# Patient Record
Sex: Female | Born: 1980 | Race: White | Hispanic: No | Smoking: Current every day smoker
Health system: Southern US, Community
[De-identification: ages and names within clinical notes are randomized; demographics above are authoritative.]

## PROBLEM LIST (undated history)

## (undated) DIAGNOSIS — F419 Anxiety disorder, unspecified: Secondary | ICD-10-CM

## (undated) DIAGNOSIS — O039 Complete or unspecified spontaneous abortion without complication: Secondary | ICD-10-CM

## (undated) DIAGNOSIS — F329 Major depressive disorder, single episode, unspecified: Secondary | ICD-10-CM

## (undated) DIAGNOSIS — F32A Depression, unspecified: Secondary | ICD-10-CM

## (undated) DIAGNOSIS — J40 Bronchitis, not specified as acute or chronic: Secondary | ICD-10-CM

## (undated) HISTORY — PX: ANKLE SURGERY: SHX546

## (undated) SURGERY — Surgical Case
Anesthesia: *Unknown

---

## 2002-12-22 ENCOUNTER — Other Ambulatory Visit: Admission: RE | Admit: 2002-12-22 | Discharge: 2002-12-22 | Payer: Self-pay | Admitting: Obstetrics & Gynecology

## 2003-06-12 ENCOUNTER — Emergency Department (HOSPITAL_COMMUNITY): Admission: EM | Admit: 2003-06-12 | Discharge: 2003-06-12 | Payer: Self-pay | Admitting: Emergency Medicine

## 2003-06-23 ENCOUNTER — Emergency Department (HOSPITAL_COMMUNITY): Admission: EM | Admit: 2003-06-23 | Discharge: 2003-06-23 | Payer: Self-pay | Admitting: Emergency Medicine

## 2005-05-26 ENCOUNTER — Inpatient Hospital Stay (HOSPITAL_COMMUNITY): Admission: EM | Admit: 2005-05-26 | Discharge: 2005-05-29 | Payer: Self-pay | Admitting: Emergency Medicine

## 2005-07-16 ENCOUNTER — Encounter (HOSPITAL_COMMUNITY): Admission: RE | Admit: 2005-07-16 | Discharge: 2005-08-15 | Payer: Self-pay | Admitting: Orthopaedic Surgery

## 2007-11-30 ENCOUNTER — Other Ambulatory Visit: Admission: RE | Admit: 2007-11-30 | Discharge: 2007-11-30 | Payer: Self-pay | Admitting: Obstetrics and Gynecology

## 2007-12-04 IMAGING — CR DG ANKLE COMPLETE 3+V*L*
3 series · 3 of 3 positions shown · non-contrast
Comparison: none

CLINICAL DATA: Stepped in hole

[view not recorded (1 of 3)]
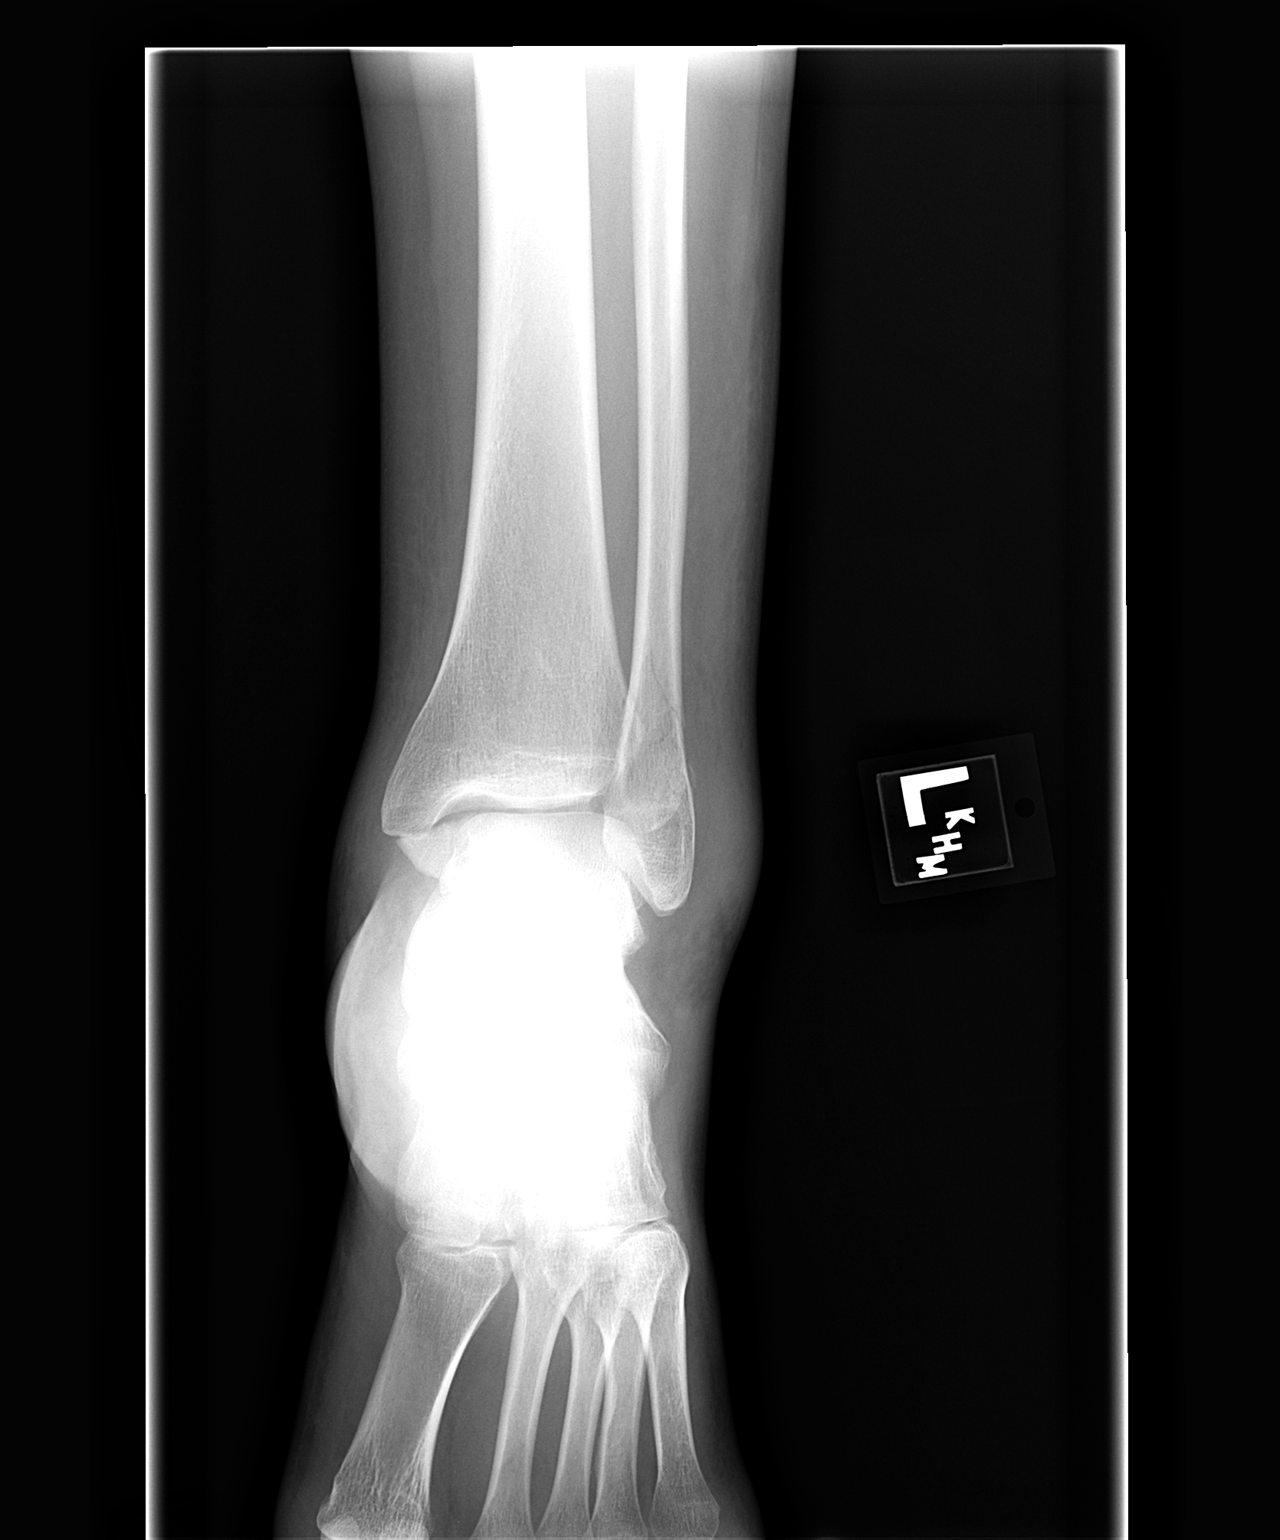

[view not recorded (2 of 3)]
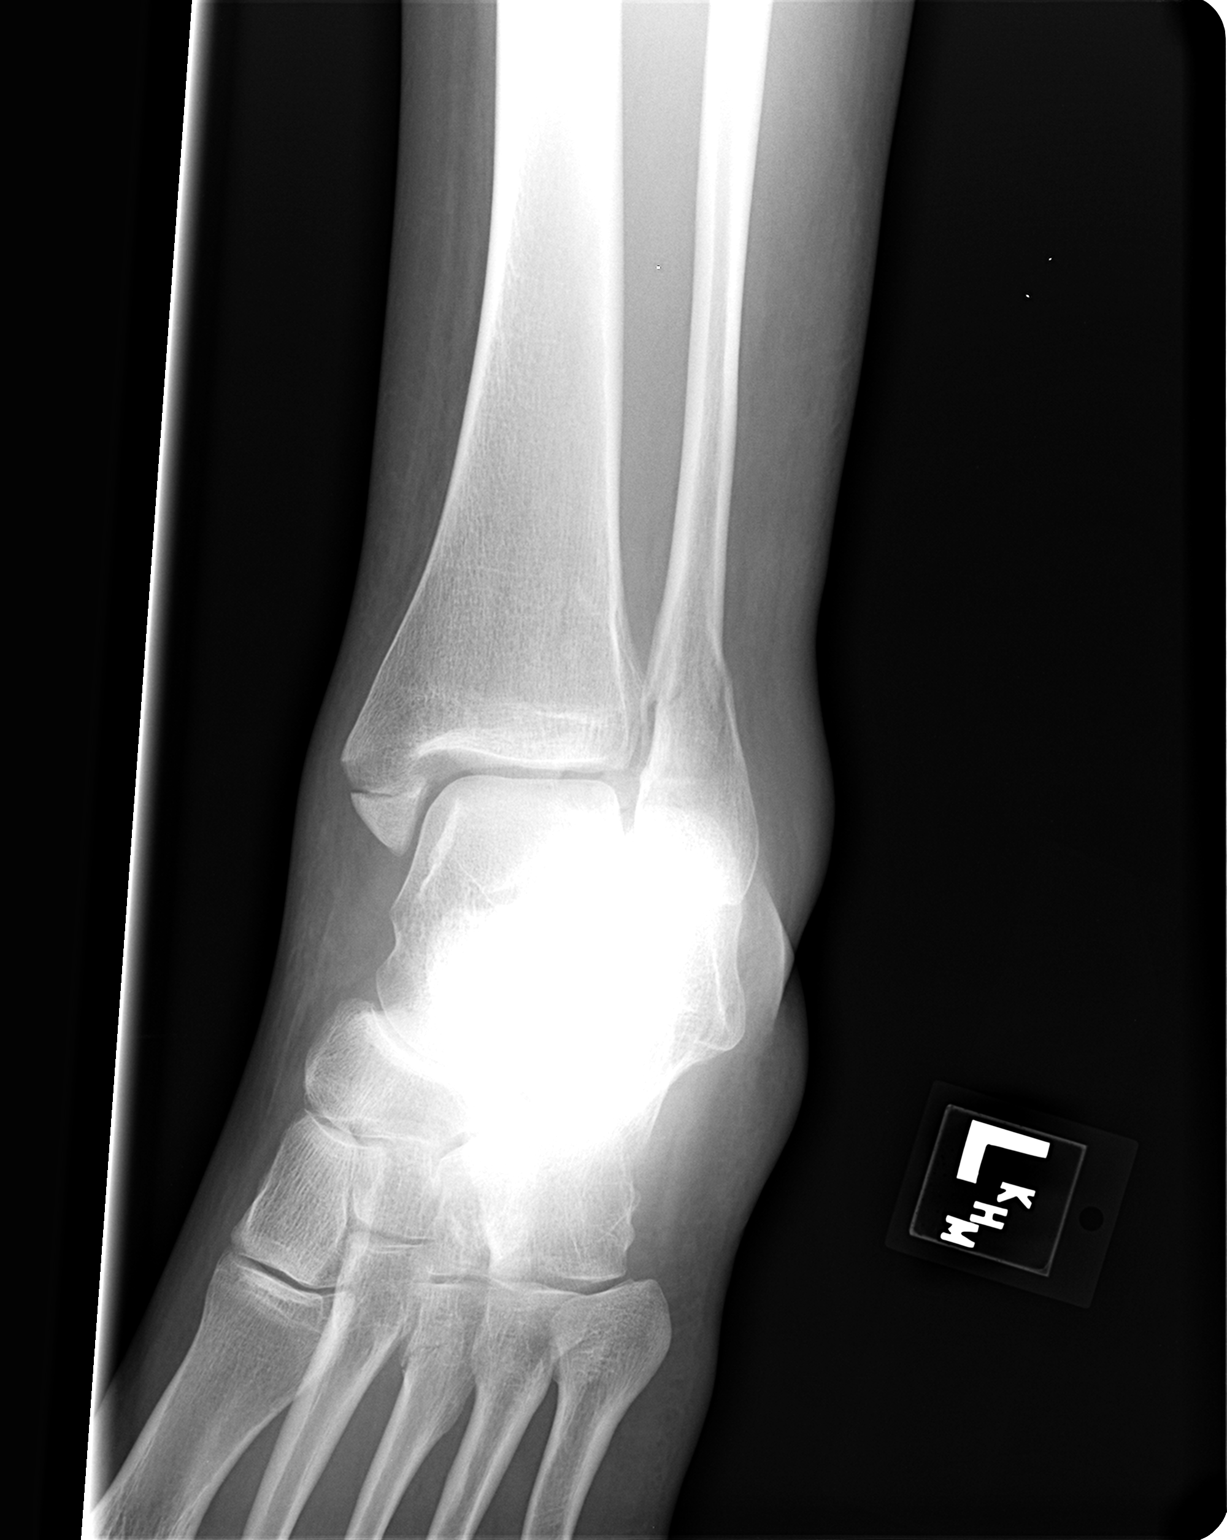

[view not recorded (3 of 3)]
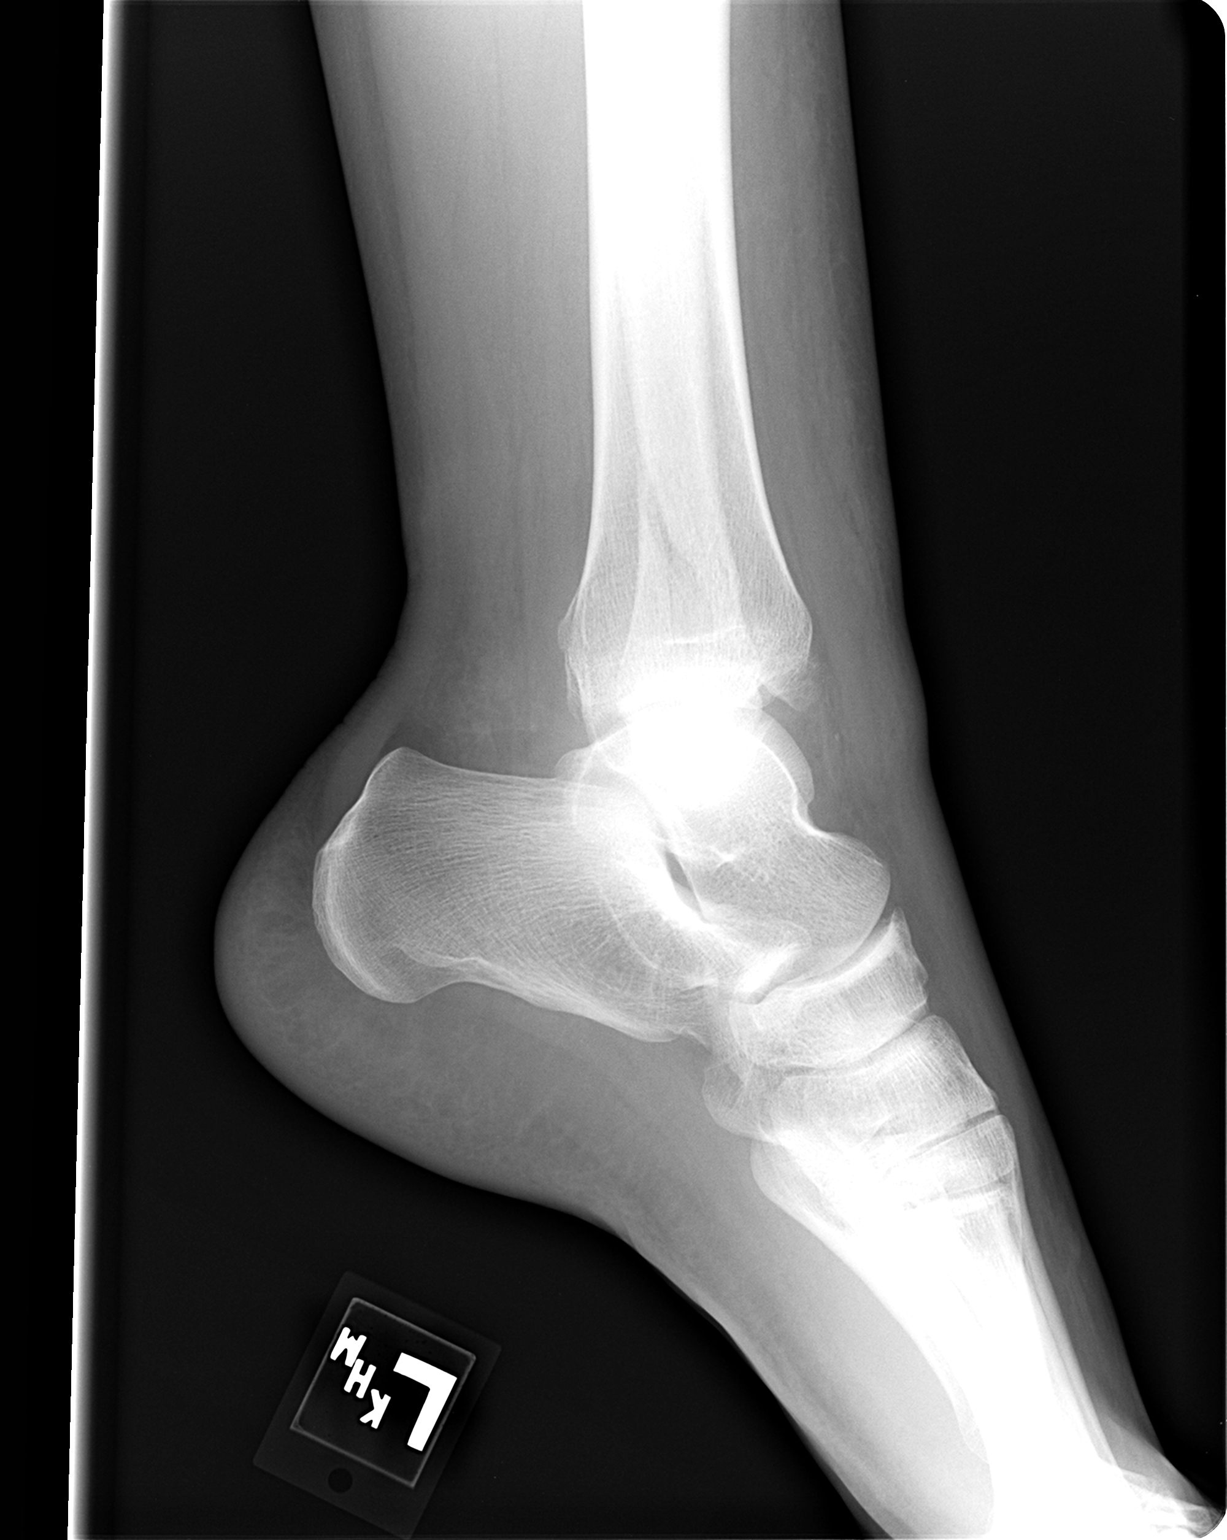

[3 of 3 positions shown; findings below may reference images not displayed]

Left ankle three-view:

No previous for comparison. Transverse fracture of the medial malleolus with
lateral displacement of distal fracture fragment and step-off deformity of the
subchondral cortex. Oblique fracture through the distal fibular shaft with
minimal lateral displacement and angulation of distal fracture fragment. Lateral
view suggests fracture of the posterior malleolus. Circumferential soft tissue
swelling is evident.
IMPRESSION: 1. Trimalleolar fracture, minimally displaced, an unstable injury.

## 2008-09-07 ENCOUNTER — Emergency Department (HOSPITAL_COMMUNITY): Admission: EM | Admit: 2008-09-07 | Discharge: 2008-09-07 | Payer: Self-pay | Admitting: Emergency Medicine

## 2008-12-24 ENCOUNTER — Inpatient Hospital Stay: Payer: Self-pay | Admitting: Unknown Physician Specialty

## 2009-01-08 ENCOUNTER — Emergency Department (HOSPITAL_COMMUNITY): Admission: EM | Admit: 2009-01-08 | Discharge: 2009-01-08 | Payer: Self-pay | Admitting: Emergency Medicine

## 2009-01-22 ENCOUNTER — Emergency Department (HOSPITAL_COMMUNITY): Admission: EM | Admit: 2009-01-22 | Discharge: 2009-01-22 | Payer: Self-pay | Admitting: Emergency Medicine

## 2009-02-12 ENCOUNTER — Emergency Department (HOSPITAL_COMMUNITY): Admission: EM | Admit: 2009-02-12 | Discharge: 2009-02-12 | Payer: Self-pay | Admitting: Emergency Medicine

## 2009-03-20 ENCOUNTER — Emergency Department (HOSPITAL_COMMUNITY): Admission: EM | Admit: 2009-03-20 | Discharge: 2009-03-20 | Payer: Self-pay | Admitting: Emergency Medicine

## 2009-08-14 ENCOUNTER — Emergency Department (HOSPITAL_COMMUNITY): Admission: EM | Admit: 2009-08-14 | Discharge: 2009-08-14 | Payer: Self-pay | Admitting: Emergency Medicine

## 2009-11-13 ENCOUNTER — Emergency Department (HOSPITAL_COMMUNITY): Admission: EM | Admit: 2009-11-13 | Discharge: 2009-11-13 | Payer: Self-pay | Admitting: Emergency Medicine

## 2010-03-08 ENCOUNTER — Emergency Department (HOSPITAL_COMMUNITY)
Admission: EM | Admit: 2010-03-08 | Discharge: 2010-03-08 | Disposition: A | Discharge status: Home / Self Care | Payer: Medicaid Other | Attending: Emergency Medicine | Admitting: Emergency Medicine

## 2010-03-08 DIAGNOSIS — K089 Disorder of teeth and supporting structures, unspecified: Secondary | ICD-10-CM | POA: Insufficient documentation

## 2010-03-08 DIAGNOSIS — K047 Periapical abscess without sinus: Secondary | ICD-10-CM | POA: Insufficient documentation

## 2010-04-08 LAB — RAPID STREP SCREEN (MED CTR MEBANE ONLY): Streptococcus, Group A Screen (Direct): NEGATIVE

## 2010-05-09 ENCOUNTER — Emergency Department (HOSPITAL_COMMUNITY)
Admission: EM | Admit: 2010-05-09 | Discharge: 2010-05-09 | Disposition: A | Discharge status: Home / Self Care | Payer: Medicaid Other | Attending: Emergency Medicine | Admitting: Emergency Medicine

## 2010-05-09 DIAGNOSIS — F329 Major depressive disorder, single episode, unspecified: Secondary | ICD-10-CM | POA: Insufficient documentation

## 2010-05-09 DIAGNOSIS — K089 Disorder of teeth and supporting structures, unspecified: Secondary | ICD-10-CM | POA: Insufficient documentation

## 2010-05-09 DIAGNOSIS — G43909 Migraine, unspecified, not intractable, without status migrainosus: Secondary | ICD-10-CM | POA: Insufficient documentation

## 2010-05-09 DIAGNOSIS — F3289 Other specified depressive episodes: Secondary | ICD-10-CM | POA: Insufficient documentation

## 2010-06-08 NOTE — H&P (Signed)
NAME:  April [name], April [name] NO.:  [id number]   MEDICAL RECORD NO.:  [id number]          PATIENT TYPE:  INP   LOCATION:  A340                          FACILITY:  [facility]   PHYSICIAN:  J. Darreld Mclean, M.D. DATE OF BIRTH:  08-Nov-1980   DATE OF ADMISSION:  05/26/2005  DATE OF DISCHARGE:  LH                                HISTORY & PHYSICAL   CHIEF COMPLAINT:  I hurt my left ankle.   The patient is a 30 year old female, last night stepped in a hole and  twisted her left ankle.  She had pain and tenderness.  She kept her foot  elevated and iced during the night.  She came to the hospital today.  X-rays  showed trimalleolar fracture of the left ankle which was nondisplaced except  for the medial malleolar portion.  No other injuries.   PAST HISTORY:  No surgery.   MEDICATIONS:  Wellbutrin and Zoloft.   No allergies.   REVIEW OF SYSTEMS:  Otherwise negative.   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  She is accompanied  by a boyfriend.   PHYSICAL EXAMINATION:  VITAL SIGNS:  Normal.  HEENT:  Negative.  NECK:  Supple.  GENERAL:  She is alert, cooperative, oriented.  LUNGS:  Clear to P&A.  HEART:  Regular without murmur heard.  ABDOMEN:  Soft, nontender without masses.  EXTREMITIES:  Other extremities are negative except for the left ankle which  has swelling, tenderness, ecchymosis.  Decreased range of motion.  CNS:  Intact.  SKIN:  Intact.   IMPRESSION:  Trimalleolar fracture, left ankle, with displacement of the  medial malleolus.   PLAN:  Open treatment internal reduction of the left ankle tomorrow.  She  has already eaten today.  Risk and imponderables of the procedure have been  discussed with the patient including infection, embolization, arthritis in  the future, need for a cast for six to eight weeks, and anesthesia risk  among others.  Labs are pending.                                            ______________________________  J. Darreld Mclean, M.D.     JWK/MEDQ  D:  05/26/2005  T:  05/26/2005  Job:  [id number]

## 2010-06-08 NOTE — Op Note (Signed)
NAME:  NYRIE, SIGAL NO.:  [id number]   MEDICAL RECORD NO.:  [id number]          PATIENT TYPE:  INP   LOCATION:  A340                          FACILITY:  [facility]   PHYSICIAN:  J. Darreld Mclean, M.D. DATE OF BIRTH:  19-Jun-1980   DATE OF PROCEDURE:  05/27/2005  DATE OF DISCHARGE:                                 OPERATIVE REPORT   PREOPERATIVE DIAGNOSIS:  Trimalleolar fracture left ankle.   POSTOPERATIVE DIAGNOSIS:  Trimalleolar fracture left ankle.   PROCEDURE:  Open treatment, internal reduction left ankle fracture.   ANESTHESIA:  General.   SURGEON:  J. Darreld Mclean, M.D.   TOURNIQUET TIME:  20 minutes.   No drains.  Posterior plaster splint applied at the end of procedure.   The patient is a 30 year old female who stepped in a hole yesterday and  sustained the above-mentioned injury.  She was seen, evaluated and admitted.  The posterior malleolar fracture, small lateral malleolus is not displaced,  the tip of the medial malleolus is displaced and into the joint; I told her  she will need surgery, I went over risks and imponderables.  She appeared to  understand and agreed with the procedure as outlined.   I decided only to do the medial side, as the other side was essentially  nondisplaced.   DESCRIPTION OF PROCEDURE:  The patient was taken to the holding area.  The  left foot was identified as the correct surgical site.  She had placed a  marker, I placed a marker on her foot, her family members were present.  She  was brought to the operating room and given general anesthesia.  While  supine on the operating room table a tourniquet placed deflated to left  upper thigh.  She was prepped and draped in the usual manner.  We had a time-  out, we were identifying the patient and we are doing the left ankle.  The  leg was elevated, wrapped circumferentially with an Esmarch bandage,  tourniquet inflated to 250 mmHg, Esmarch bandage removed.  A medial  incision  was made and the medial malleolus was identified and the small fragment  distally was identified.  Part of the deltoid ligament was attached.  With  careful dissection, the fragment was brought forward and held in place in  anatomical position with the C-arm assist with a clamp.  I then placed a  0.062 smooth Kirschner wire to hold the fragment in place, then drilled the  2.7 drill bit, and then a 35 mm long cancellous screw with no screws at the  most proximal end was inserted.  Compression was applied, this gave good  fixation of the fracture fragment.  The K-wire was removed.  X-rays show  good position, alignment of the fracture is anatomic.  The other fragments  were anatomic and elected  not to put a plate laterally.  The wound was reapproximated using #2-0  chromic, #2-0 plain and then skin staples.  A sterile dressing applied, a  bulky dressing applied and then posterior splint applied.  She tolerated the  procedure well and went  to recovery in good condition.           ______________________________  Shela Commons. Darreld Mclean, M.D.     JWK/MEDQ  D:  05/27/2005  T:  05/28/2005  Job:  [id number]

## 2010-06-08 NOTE — Op Note (Signed)
NAME:  April [name], April [name] NO.:  [id number]   MEDICAL RECORD NO.:  [id number]          PATIENT TYPE:  INP   LOCATION:  A340                          FACILITY:  [facility]   PHYSICIAN:  J. Darreld Mclean, M.D. DATE OF BIRTH:  1980-08-28   DATE OF PROCEDURE:  DATE OF DISCHARGE:                                 OPERATIVE REPORT   Audio too short to transcribe (less than 5 seconds)           ______________________________  J. Darreld Mclean, M.D.     JWK/MEDQ  D:  05/27/2005  T:  05/27/2005  Job:  [id number]

## 2010-06-08 NOTE — Discharge Summary (Signed)
NAME:  April [name], April [name] NO.:  [id number]   MEDICAL RECORD NO.:  [id number]          PATIENT TYPE:  INP   LOCATION:  A340                          FACILITY:  [facility]   PHYSICIAN:  J. Darreld Mclean, M.D. DATE OF BIRTH:  1980/10/07   DATE OF ADMISSION:  05/26/2005  DATE OF DISCHARGE:  05/09/2007LH                                 DISCHARGE SUMMARY   DISCHARGE DIAGNOSIS:  Trimalleolar fracture of the left ankle with  displacement of the medial malleolus.   DISCHARGE STATUS:  Improved.   PROGNOSIS:  Good.   DISPOSITION:  Home.   DISCHARGE MEDICATIONS:  Vicodin ES one every four hours p.r.n. pain.   The patient is to elevate, keep dry, use crutches.  See me in my office on  May 23 at 9 o'clock.   PROCEDURE PERFORMED:  Open treatment, internal reduction of left ankle  fracture.   The patient was seen and evaluated in the emergency room and found to have a  trimalleolar fracture of her ankle.  She was brought to the operating room  on the 7th, where she underwent fixation.  She tolerated it well.  She was  afebrile.  During the evening I changed her from morphine to Dilaudid  because she has a reaction to the morphine.  Physical therapy was begun.  She did well in therapy.  She was walking independently at the time of  discharge the following day.  Neurovascular was intact.  She was having very  minimal difficulty, very minimal pain.  Will see her in the office as  stated.  Labs were otherwise normal.                                            ______________________________  J. Darreld Mclean, M.D.     JWK/MEDQ  D:  07/04/2005  T:  07/04/2005  Job:  [id number]

## 2011-01-20 ENCOUNTER — Emergency Department (HOSPITAL_COMMUNITY)
Admission: EM | Admit: 2011-01-20 | Discharge: 2011-01-20 | Disposition: A | Discharge status: Home / Self Care | Payer: Medicaid Other | Attending: Emergency Medicine | Admitting: Emergency Medicine

## 2011-01-20 ENCOUNTER — Encounter: Payer: Self-pay | Admitting: Emergency Medicine

## 2011-01-20 DIAGNOSIS — F329 Major depressive disorder, single episode, unspecified: Secondary | ICD-10-CM

## 2011-01-20 DIAGNOSIS — F419 Anxiety disorder, unspecified: Secondary | ICD-10-CM

## 2011-01-20 DIAGNOSIS — F411 Generalized anxiety disorder: Secondary | ICD-10-CM | POA: Insufficient documentation

## 2011-01-20 DIAGNOSIS — F17200 Nicotine dependence, unspecified, uncomplicated: Secondary | ICD-10-CM | POA: Insufficient documentation

## 2011-01-20 DIAGNOSIS — R45851 Suicidal ideations: Secondary | ICD-10-CM | POA: Insufficient documentation

## 2011-01-20 DIAGNOSIS — F32A Depression, unspecified: Secondary | ICD-10-CM

## 2011-01-20 DIAGNOSIS — F3130 Bipolar disorder, current episode depressed, mild or moderate severity, unspecified: Secondary | ICD-10-CM | POA: Insufficient documentation

## 2011-01-20 HISTORY — DX: Depression, unspecified: F32.A

## 2011-01-20 HISTORY — DX: Anxiety disorder, unspecified: F41.9

## 2011-01-20 HISTORY — DX: Bronchitis, not specified as acute or chronic: J40

## 2011-01-20 HISTORY — DX: Major depressive disorder, single episode, unspecified: F32.9

## 2011-01-20 LAB — RAPID URINE DRUG SCREEN, HOSP PERFORMED
Barbiturates: NOT DETECTED
Cocaine: NOT DETECTED

## 2011-01-20 LAB — BASIC METABOLIC PANEL
Chloride: 102 mEq/L (ref 96–112)
Creatinine, Ser: 0.52 mg/dL (ref 0.50–1.10)
GFR calc Af Amer: >90 mL/min (ref >90)
GFR calc non Af Amer: >90 mL/min (ref >90)
Glucose, Bld: 92 mg/dL (ref 70–99)
Potassium: 3.6 mEq/L (ref 3.5–5.1)

## 2011-01-20 LAB — DIFFERENTIAL
Basophils Absolute: 0 10*3/uL (ref 0.0–0.1)
Eosinophils Absolute: 0 10*3/uL (ref 0.0–0.7)
Lymphocytes Relative: 25 % (ref 12–46)
Lymphs Abs: 1.7 10*3/uL (ref 0.7–4.0)
Neutrophils Relative %: 72 % (ref 43–77)

## 2011-01-20 LAB — CBC
Hemoglobin: 13.3 g/dL (ref 12.0–15.0)
MCH: 31.1 pg (ref 26.0–34.0)
MCHC: 34.5 g/dL (ref 30.0–36.0)
MCV: 90.2 fL (ref 78.0–100.0)

## 2011-01-20 LAB — PREGNANCY, URINE: Preg Test, Ur: NEGATIVE

## 2011-01-20 MED ORDER — NICOTINE 21 MG/24HR TD PT24: 21.0000 mg | MEDICATED_PATCH | Freq: Every day | TRANSDERMAL | Status: DC

## 2011-01-20 MED ORDER — ACETAMINOPHEN 325 MG PO TABS: 650.0000 mg | ORAL_TABLET | ORAL | Status: DC | PRN

## 2011-01-20 MED ORDER — BENZONATATE 100 MG PO CAPS: 200.0000 mg | ORAL_CAPSULE | Freq: Once | ORAL | Status: DC

## 2011-01-20 MED ORDER — LORAZEPAM 1 MG PO TABS: 1.0000 mg | ORAL_TABLET | Freq: Three times a day (TID) | ORAL | Status: DC | PRN

## 2011-01-20 MED ORDER — IBUPROFEN 400 MG PO TABS: 600.0000 mg | ORAL_TABLET | Freq: Three times a day (TID) | ORAL | Status: DC | PRN

## 2011-01-20 MED ORDER — ONDANSETRON HCL 4 MG PO TABS: 4.0000 mg | ORAL_TABLET | Freq: Three times a day (TID) | ORAL | Status: DC | PRN

## 2011-01-20 NOTE — ED Notes (Addendum)
Pt requesting something to be given for her cough, congestion, Dr. Freida Busman notified, tessalon ordered. Pt states that she does not need them at present time. Advised pt to let nursing staff know if she changes her mind. Pt also refused nicotine patch at present time

## 2011-01-20 NOTE — ED Notes (Signed)
Patient c/o cough and nasal congestion x2 weeks. Per patient seen PCP and was diagnosed with bronchitis. Given inhaler and z pack. Per patient not feeling any better. Denies any fevers.

## 2011-01-20 NOTE — BH Assessment (Signed)
Assessment Note   April [name] is an 30 y.o. female. PT PRESENTS WITH INCREASE DEPRESSION & SUICIDAL THOUGHT X 4 MONTHS. PT SAYS SHE DOES NOT HAVE A PLAN & HAS BEEN HAVING A HAD TIME GETTING IN TO SEE A PROVIDER. PT IS ABLE TO CONTRACT FOR SAFETY. PT SAYS SHE IS UNABLE TO SEE & THERE IS NO PARTICULAR STRESS THAT START HER FEELING THIS WAY BUT DURING THE HOLIDAYS HER DEPRESSION WORSENS. PT IS ABLE TO CONTRACT FOR SAFETY & SAYS SHE WOULD LIKE TO GET INTO AN INTENSIVE IN HOME PROGRAM. PT DENIES HI OR AV OR SA BUT IS WANTING HELP TO SEE A PROVIDER A SOON AS POSSIBLE. PT SAYS SHE USE TO HAVE SOME CRISIS LINE WHO WOULD SEND PEOPLE TO HER HOME TO HELP BUT LATER REFERRED HER TO [facility] BUT DUE TO TRANSPORT GO NOT GO. PT EXPRESSED THAT [place] WAS MORE CONVIENT FOR HER & SHE HAS AN APPT WITH [facility] BUT ITS TOO FAR 7 WOULD LIKE SOME HELP NOW. PT HAS AGREED TO FOLLOW UP WITH [facility] IN [place] IF AN APPT CAN BE SET UP FOR HER. PT WILL ALSO BE REFERRED TO [facility] IN [place] TO SEE IF SHE CAN GET IN WITH A PROVIDER. EDP HAS AGREED WITH DISPOSITION & WILL DISCHARGE PT HOME.   Axis I: Bipolar, Depressed and Depressive Disorder NOS Axis II: Deferred Axis III:  Past Medical History  Diagnosis Date  . Bronchitis   . Depression   . Anxiety    Axis IV: problems related to social environment and problems with primary support group Axis V: 51-60 moderate symptoms  Past Medical History:  Past Medical History  Diagnosis Date  . Bronchitis   . Depression   . Anxiety     Past Surgical History  Procedure Date  . Ankle surgery     left    Family History:  Family History  Problem Relation Age of Onset  . Hypertension Mother   . Diabetes Other   . Cancer Other   . Hypertension Other     Social History:  reports that she has been smoking Cigarettes.  She has a .6 pack-year smoking history. She has never used smokeless tobacco. She reports that she does  not drink alcohol or use illicit drugs.  Additional Social History:    Allergies:  Allergies  Allergen Reactions  . Other Hives    Cashews    Home Medications:  Medications Prior to Admission  Medication Dose Route Frequency Provider Last Rate Last Dose  . acetaminophen (TYLENOL) tablet 650 mg  650 mg Oral Q4H PRN Toy Baker, MD      . ibuprofen (ADVIL,MOTRIN) tablet 600 mg  600 mg Oral Q8H PRN Toy Baker, MD      . LORazepam (ATIVAN) tablet 1 mg  1 mg Oral Q8H PRN Toy Baker, MD      . nicotine (NICODERM CQ - dosed in mg/24 hours) patch 21 mg  21 mg Transdermal Daily Toy Baker, MD      . ondansetron [facility]) tablet 4 mg  4 mg Oral Q8H PRN Toy Baker, MD       No current outpatient prescriptions on file as of 01/20/2011.    OB/GYN Status:  Patient's last menstrual period was 01/01/2011.  General Assessment Data Location of Assessment: [facility] Assessment: Yes Living Arrangements: Parent Can pt return to current living arrangement?: Yes Admission Status: Voluntary Is patient capable of  signing voluntary admission?: Yes Transfer from: Acute Hospital Referral Source: Self/Family/Friend     Risk to self Suicidal Ideation: Yes-Currently Present Suicidal Intent: Yes-Currently Present Is patient at risk for suicide?: Yes Suicidal Plan?: No Specify Current Suicidal Plan: NO SPECIFIC Access to Means: Yes Specify Access to Suicidal Means: SHARP OBJECTS What has been your use of drugs/alcohol within the last 12 months?: NA Previous Attempts/Gestures: Yes How many times?: 2  Other Self Harm Risks: NA Triggers for Past Attempts: None known Intentional Self Injurious Behavior: None Family Suicide History: Yes Recent stressful life event(s): Turmoil (Comment) Persecutory voices/beliefs?: No Depression: Yes Depression Symptoms: Loss of interest in usual pleasures;Feeling worthless/self pity;Fatigue;Isolating;Insomnia Substance abuse history and/or  treatment for substance abuse?: No Suicide prevention information given to non-admitted patients: Not applicable  Risk to Others Homicidal Ideation: No Thoughts of Harm to Others: No Current Homicidal Intent: No Current Homicidal Plan: No Access to Homicidal Means: No Identified Victim: NA History of harm to others?: No Assessment of Violence: None Noted Violent Behavior Description: CALM COOPERATIVE Does patient have access to weapons?: Yes (Comment) Criminal Charges Pending?: No Does patient have a court date: No  Psychosis Hallucinations: None noted Delusions: None noted  Mental Status Report Appear/Hygiene: Improved Eye Contact: Good Speech: Soft;Slow Level of Consciousness: Alert Mood: Depressed;Anhedonia;Despair Affect: Depressed Anxiety Level: None Thought Processes: Coherent;Relevant Judgement: Impaired Orientation: Person;Place;Time;Situation Obsessive Compulsive Thoughts/Behaviors: None  Cognitive Functioning Concentration: Decreased Memory: Recent Intact;Remote Intact IQ: Average Insight: Poor Impulse Control: Poor Appetite: Poor Weight Loss: 0  Weight Gain: 0  Sleep: Decreased Total Hours of Sleep: 2  Vegetative Symptoms: None  Prior Inpatient Therapy Prior Inpatient Therapy: Yes Prior Therapy Dates: 2010 Prior Therapy Facilty/Provider(s): [facility] Reason for Treatment: STABILIZATION  Prior Outpatient Therapy Prior Outpatient Therapy: No Prior Therapy Dates: NA Prior Therapy Facilty/Provider(s): NA Reason for Treatment: NA            Values / Beliefs Cultural Requests During Hospitalization: None Spiritual Requests During Hospitalization: None        Additional Information 1:1 In Past 12 Months?: No Elopement Risk: No Does patient have medical clearance?: No     Disposition:  Disposition Disposition of Patient: Outpatient treatment;Referred to ([facility], [facility]) Type of inpatient treatment program: Adult Type  of outpatient treatment: Psych Intensive Outpatient  On Site Evaluation by:   Reviewed with Physician:     Waldron Session 01/20/2011 2:55 PM

## 2011-01-20 NOTE — ED Notes (Signed)
Pt wanded by security, sitter at bedside, pt updated on policy for suicide precautions, expressed understanding.

## 2011-01-20 NOTE — ED Notes (Signed)
Katherine ([facility]) here to evaluate pt. Family at bedside,

## 2011-01-20 NOTE — ED Notes (Signed)
Pt states that she has been having problems with cough and congestion for two weeks, problems with SI for two months, pt alert, cooperative, states that she has had problems with SI before about two years ago, was tx at [facility], had outpatient therapy, states that she thought she was dx with seasonal affective disorder. Pt states that the depression started two months ago, denies any triggers. Had thought about killing herself and thought about different ways that she could but states that she has not decided on a plan

## 2011-01-20 NOTE — ED Notes (Signed)
Patient reports suicidal thoughts, requesting help.

## 2011-01-20 NOTE — ED Notes (Signed)
Pt given lunch tray, comfort measures provided, sitter at bedside,

## 2011-01-20 NOTE — Progress Notes (Signed)
1510 Assumed care/disposition of patient. Patient is here with suicidal ideation. She is under evaluation by [facility]. 1650 Advised by [facility] that patient will contract for safety. She has been given referral information.

## 2011-01-20 NOTE — ED Notes (Signed)
Pt able to contract for safety, pt understands that if any feelings change to come back to er. Pt expressed understanding.

## 2013-11-22 ENCOUNTER — Encounter: Payer: Self-pay | Admitting: Emergency Medicine

## 2018-09-01 ENCOUNTER — Other Ambulatory Visit: Payer: Self-pay

## 2018-09-01 ENCOUNTER — Encounter (HOSPITAL_COMMUNITY): Payer: Self-pay | Admitting: Psychiatric/Mental Health

## 2018-09-01 ENCOUNTER — Emergency Department (HOSPITAL_COMMUNITY)
Admission: EM | Admit: 2018-09-01 | Discharge: 2018-09-02 | Disposition: A | Discharge status: Other Acute Inpatient Hospital | Payer: Self-pay | Attending: Emergency Medicine | Admitting: Emergency Medicine

## 2018-09-01 ENCOUNTER — Encounter (HOSPITAL_COMMUNITY): Payer: Self-pay | Admitting: Emergency Medicine

## 2018-09-01 DIAGNOSIS — R45851 Suicidal ideations: Secondary | ICD-10-CM | POA: Insufficient documentation

## 2018-09-01 DIAGNOSIS — F17210 Nicotine dependence, cigarettes, uncomplicated: Secondary | ICD-10-CM | POA: Insufficient documentation

## 2018-09-01 DIAGNOSIS — F1410 Cocaine abuse, uncomplicated: Secondary | ICD-10-CM | POA: Insufficient documentation

## 2018-09-01 DIAGNOSIS — Z20828 Contact with and (suspected) exposure to other viral communicable diseases: Secondary | ICD-10-CM | POA: Insufficient documentation

## 2018-09-01 DIAGNOSIS — F329 Major depressive disorder, single episode, unspecified: Secondary | ICD-10-CM | POA: Insufficient documentation

## 2018-09-01 LAB — COMPREHENSIVE METABOLIC PANEL
ALT: 11 U/L (ref 0–44)
AST: 14 U/L — ABNORMAL LOW (ref 15–41)
Albumin: 3.9 g/dL (ref 3.5–5.0)
Alkaline Phosphatase: 45 U/L (ref 38–126)
Anion gap: 11 (ref 5–15)
BUN: 10 mg/dL (ref 6–20)
CO2: 23 mmol/L (ref 22–32)
Calcium: 7.9 mg/dL — ABNORMAL LOW (ref 8.9–10.3)
Chloride: 106 mmol/L (ref 98–111)
Creatinine, Ser: 0.53 mg/dL (ref 0.44–1.00)
GFR calc Af Amer: >60 mL/min (ref >60)
GFR calc non Af Amer: >60 mL/min (ref >60)
Glucose, Bld: 117 mg/dL — ABNORMAL HIGH (ref 70–99)
Potassium: 3.1 mmol/L — ABNORMAL LOW (ref 3.5–5.1)
Sodium: 140 mmol/L (ref 135–145)
Total Bilirubin: 0.6 mg/dL (ref 0.3–1.2)
Total Protein: 7 g/dL (ref 6.5–8.1)

## 2018-09-01 LAB — RAPID URINE DRUG SCREEN, HOSP PERFORMED
Amphetamines: NOT DETECTED
Barbiturates: NOT DETECTED
Benzodiazepines: NOT DETECTED
Cocaine: POSITIVE — AB
Opiates: NOT DETECTED
Tetrahydrocannabinol: NOT DETECTED

## 2018-09-01 LAB — SALICYLATE LEVEL: Salicylate Lvl: <7 mg/dL (ref 2.8–30.0)

## 2018-09-01 LAB — PREGNANCY, URINE: Preg Test, Ur: NEGATIVE

## 2018-09-01 LAB — CBC
HCT: 39.4 % (ref 36.0–46.0)
Hemoglobin: 12.8 g/dL (ref 12.0–15.0)
MCH: 31.2 pg (ref 26.0–34.0)
MCHC: 32.5 g/dL (ref 30.0–36.0)
MCV: 96.1 fL (ref 80.0–100.0)
Platelets: 303 10*3/uL (ref 150–400)
RBC: 4.1 MIL/uL (ref 3.87–5.11)
RDW: 12.6 % (ref 11.5–15.5)
WBC: 12.9 10*3/uL — ABNORMAL HIGH (ref 4.0–10.5)
nRBC: 0 % (ref 0.0–0.2)

## 2018-09-01 LAB — ETHANOL: Alcohol, Ethyl (B): 201 mg/dL — ABNORMAL HIGH (ref <10)

## 2018-09-01 LAB — SARS CORONAVIRUS 2 BY RT PCR (HOSPITAL ORDER, PERFORMED IN ~~LOC~~ HOSPITAL LAB): SARS Coronavirus 2: NEGATIVE

## 2018-09-01 LAB — ACETAMINOPHEN LEVEL: Acetaminophen (Tylenol), Serum: <10 ug/mL — ABNORMAL LOW (ref 10–30)

## 2018-09-01 MED ORDER — POTASSIUM CHLORIDE CRYS ER 20 MEQ PO TBCR
40.0000 meq | EXTENDED_RELEASE_TABLET | Freq: Once | ORAL | Status: AC
  Administered 2018-09-01: 40 meq via ORAL
  Filled 2018-09-01: qty 2

## 2018-09-01 NOTE — ED Triage Notes (Signed)
Pt brought in by EMS. EMS was called out because pt was lying on the side of the road. When EMS arrived, pt was sitting on side of the road. Pt admitted to ETOH use which she used to self medicate for her anxiety. Pt states she also took Benadryl to help her sleep. Pt admits to SI, but was not trying to hurt herself today.Pt has hx of depression and anxiety, but has not had any medications to treat for over 3 years. EMS states pt initial temp was 99.8, but after giving her an ice pack and bringing her in truck, pt's temp decreased to 98.0. Pt calm and cooperative at this time.

## 2018-09-01 NOTE — ED Notes (Signed)
Patients belongings consist of two pairs of shoes, dress, sweatshirt, and a phone. Placed in EMS locker room.

## 2018-09-01 NOTE — BH Assessment (Signed)
Tele Assessment Note   Patient Name: April SpillersLaura A Kuwahara MRN: [id number] Referring Physician: Dr. Raeford RazorStephen Kohut Location of Patient: [facility] Location of Provider: Behavioral Health TTS Department  April SpillersLaura A April [name] is an 38 y.o. female presenting with SI and was found on the side of the road. Patient reported SI since the age of 38 years old. Patient reported onset of present episode 2 weeks ago since her boyfriends father died, trigger is that he was a reminder of when her father passed away. Patient was vague about lying in the middle of the road and wanting to harm herself. Patient reported "I was on my way to grave yard to see my father and I sat down, like I was not going to make it". Patient reported 5 years ago inpatient mental health tx, location unknown, prior suicide attempts and self-harming behaviors. Patient denied HI ans psychosis. Patient reported drinking 4 beers daily. Patient reported increased depressive symptoms. Patient admitted to ETOH use which she used to self medicate for her anxiety. Patient states she also took Benadryl to help her sleep.  Patient resides with boyfriend and boyfriends mother. 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 Patient was cooperative and tearful during assessment.   Diagnosis: Major depressive disorder  Past Medical History:  Past Medical History:  Diagnosis Date  . Anxiety   . Bronchitis   . Depression     Past Surgical History:  Procedure Laterality Date  . ANKLE SURGERY     left    Family History:  Family History  Problem Relation Age of Onset  . Hypertension Mother   . Diabetes Other   . Cancer Other   . Hypertension Other     Social History:  reports that she has been smoking cigarettes. She has a 0.60 pack-year smoking history. She has never used smokeless tobacco. She reports current alcohol use. She reports that she does not use drugs.  Additional Social History:  Alcohol / Drug Use Pain  Medications: see MAR Prescriptions: see MAR Over the Counter: see MAR  CIWA: CIWA-Ar BP: 134/88 Pulse Rate: 87 COWS:    Allergies:  Allergies  Allergen Reactions  . Other Hives    Cashews    Home Medications: (Not in a hospital admission)   OB/GYN Status:  Patient's last menstrual period was 08/07/2018 (approximate).  General Assessment Data Location of Assessment: [facility] TTS Assessment: In system Is this a Tele or Face-to-Face Assessment?: Tele Assessment Is this an Initial Assessment or a Re-assessment for this encounter?: Initial Assessment Patient Accompanied by:: N/A Language Other than English: No Living Arrangements: (boyfriend) What gender do you identify as?: Female Marital status: Single Pregnancy Status: Unknown Living Arrangements: Spouse/significant other, Non-relatives/Friends Can pt return to current living arrangement?: Yes Admission Status: Voluntary Is patient capable of signing voluntary admission?: Yes Referral Source: Self/Family/Friend     Crisis Care Plan Living Arrangements: Spouse/significant other, Non-relatives/Friends Legal Guardian: (self) Name of Psychiatrist: (none) Name of Therapist: (none)  Education Status Is patient currently in school?: No Is the patient employed, unemployed or receiving disability?: Unemployed  Risk to self with the past 6 months Suicidal Ideation: Yes-Currently Present Has patient been a risk to self within the past 6 months prior to admission? : Yes Suicidal Intent: Yes-Currently Present Has patient had any suicidal intent within the past 6 months prior to admission? : Yes Is patient at risk for suicide?: Yes Suicidal Plan?: No Has patient had any suicidal plan within the  past 6 months prior to admission? : No Access to Means: No What has been your use of drugs/alcohol within the last 12 months?: (alcohol) Previous Attempts/Gestures: No How many times?: (0) Other Self Harm Risks: (none) Triggers for  Past Attempts: (n/a) Intentional Self Injurious Behavior: None Family Suicide History: No Recent stressful life event(s): (grief loss) Persecutory voices/beliefs?: No Depression: Yes Depression Symptoms: Insomnia, Tearfulness, Isolating, Fatigue, Guilt, Loss of interest in usual pleasures, Feeling worthless/self pity Substance abuse history and/or treatment for substance abuse?: No Suicide prevention information given to non-admitted patients: Not applicable  Risk to Others within the past 6 months Homicidal Ideation: No Does patient have any lifetime risk of violence toward others beyond the six months prior to admission? : No Thoughts of Harm to Others: No Current Homicidal Intent: No Current Homicidal Plan: No Access to Homicidal Means: No Identified Victim: (n/a) History of harm to others?: No Assessment of Violence: None Noted Violent Behavior Description: (none) Does patient have access to weapons?: No Criminal Charges Pending?: No Does patient have a court date: No Is patient on probation?: No  Psychosis Hallucinations: None noted Delusions: None noted  Mental Status Report Appearance/Hygiene: Unremarkable Eye Contact: Poor Motor Activity: Freedom of movement Speech: Logical/coherent Level of Consciousness: Alert Mood: Depressed Affect: Depressed Anxiety Level: Minimal Thought Processes: Coherent, Relevant Judgement: Partial Orientation: Person, Place, Time, Situation Obsessive Compulsive Thoughts/Behaviors: None  Cognitive Functioning Concentration: Good Memory: Recent Impaired Is patient IDD: No Insight: Poor Impulse Control: Poor Appetite: Fair Have you had any weight changes? : No Change Sleep: No Change Total Hours of Sleep: (6-7) Vegetative Symptoms: None  ADLScreening [facility] Assessment Services) Patient's cognitive ability adequate to safely complete daily activities?: Yes Patient able to express need for assistance with ADLs?: Yes Independently  performs ADLs?: Yes (appropriate for developmental age)  Prior Inpatient Therapy Prior Inpatient Therapy: No  Prior Outpatient Therapy Prior Outpatient Therapy: No Does patient have an ACCT team?: No Does patient have Intensive In-House Services?  : No Does patient have [facility] services? : No Does patient have P4CC services?: No  ADL Screening (condition at time of admission) Patient's cognitive ability adequate to safely complete daily activities?: Yes Patient able to express need for assistance with ADLs?: Yes Independently performs ADLs?: Yes (appropriate for developmental age)  Regulatory affairs officer (For Healthcare) Does Patient Have a Medical Advance Directive?: No Would patient like information on creating a medical advance directive?: No - Patient declined   Disposition:  Disposition Initial Assessment Completed for this Encounter: Yes  Anette Riedel, NP, patient meets inpatient criteria. AC, reviewing for [facility].   This service was provided via telemedicine using a 2-way, interactive audio and video technology.  Names of all persons participating in this telemedicine service and their role in this encounter. Name: Dinorah Masullo Role: Patient  Name: Kirtland Bouchard Role: TTS Clinician  Name:  Role:   Name:  Role:     Venora Maples 09/01/2018 8:39 PM

## 2018-09-01 NOTE — ED Provider Notes (Signed)
[facility] Provider Note   CSN: [id number] Arrival date & time: 09/01/18  1800    History   Chief Complaint Chief Complaint  Patient presents with  . V70.1    HPI April [name] is a 38 y.o. female.     38yo female brought in by EMS, patient sleeping, arouses to painful stimuli and is subsequently awake, provides limited history that she self medicates with alcohol for anxiety, denies drug use, I without plan. Per nurses notes- EMS was called out because pt was lying on the side of the road. When EMS arrived, pt was sitting on side of the road. Pt admitted to ETOH use which she used to self medicate for her anxiety. Pt states she also took Benadryl to help her sleep. Pt admits to SI, but was not trying to hurt herself today.Pt has hx of depression and anxiety, but has not had any medications to treat for over 3 years. EMS states pt initial temp was 99.8, but after giving her an ice pack and bringing her in truck, pt's temp decreased to 98.0. Pt calm and cooperative at this time.     Past Medical History:  Diagnosis Date  . Anxiety   . Bronchitis   . Depression     There are no active problems to display for this patient.   Past Surgical History:  Procedure Laterality Date  . ANKLE SURGERY     left     OB History    Gravida  1   Para  1   Term  1   Preterm      AB      Living  1     SAB      TAB      Ectopic      Multiple      Live Births               Home Medications    Prior to Admission medications   Medication Sig Start Date End Date Taking? Authorizing Provider  diphenhydrAMINE (BENADRYL) 25 mg capsule Take 25 mg by mouth daily as needed for sleep.   Yes [provider]  albuterol (PROVENTIL HFA;VENTOLIN HFA) 108 (90 BASE) MCG/ACT inhaler Inhale 2 puffs into the lungs every 6 (six) hours as needed. For bronchitis     [provider]    Family History Family History  Problem Relation Age of  Onset  . Hypertension Mother   . Diabetes Other   . Cancer Other   . Hypertension Other     Social History Social History   Tobacco Use  . Smoking status: Current Every Day Smoker    Packs/day: 0.05    Years: 12.00    Pack years: 0.60    Types: Cigarettes  . Smokeless tobacco: Never Used  Substance Use Topics  . Alcohol use: Yes    Comment: daily  . Drug use: No     Allergies   Other   Review of Systems Review of Systems  Unable to perform ROS: Psychiatric disorder  Psychiatric/Behavioral: Positive for suicidal ideas.     Physical Exam Updated Vital Signs BP 134/88 (BP Location: Right Arm)   Pulse 87   Temp 98.8 F (37.1 C) (Oral)   Resp 16   Ht 5\' 4"  (1.626 m)   Wt 58.1 kg   LMP 08/07/2018 (Approximate)   SpO2 100%   BMI 21.97 kg/m   Physical Exam Vitals signs and nursing note reviewed.  Constitutional:      General: She is not in acute distress.    Appearance: She is well-developed. She is not diaphoretic.     Comments: Sleeping, arouses to painful stimuli and subsequently alert.  HENT:     Head: Normocephalic and atraumatic.  Eyes:     Comments: Pupils 18mm, reactive  Cardiovascular:     Rate and Rhythm: Normal rate and regular rhythm.     Pulses: Normal pulses.     Heart sounds: Normal heart sounds.  Pulmonary:     Effort: Pulmonary effort is normal.     Breath sounds: Normal breath sounds.  Abdominal:     Tenderness: There is no abdominal tenderness.  Skin:    General: Skin is warm and dry.     Findings: No erythema or rash.  Neurological:     Mental Status: She is alert and oriented to person, place, and time.  Psychiatric:        Mood and Affect: Mood is depressed.        Speech: Speech is slurred.        Behavior: Behavior is withdrawn.        Thought Content: Thought content includes suicidal ideation. Thought content does not include homicidal ideation. Thought content does not include suicidal plan.      ED Treatments /  Results  Labs (all labs ordered are listed, but only abnormal results are displayed) Labs Reviewed  COMPREHENSIVE METABOLIC PANEL - Abnormal; Notable for the following components:      Result Value   Potassium 3.1 (*)    Glucose, Bld 117 (*)    Calcium 7.9 (*)    AST 14 (*)    All other components within normal limits  ETHANOL - Abnormal; Notable for the following components:   Alcohol, Ethyl (B) 201 (*)    All other components within normal limits  ACETAMINOPHEN LEVEL - Abnormal; Notable for the following components:   Acetaminophen (Tylenol), Serum <10 (*)    All other components within normal limits  CBC - Abnormal; Notable for the following components:   WBC 12.9 (*)    All other components within normal limits  SALICYLATE LEVEL  RAPID URINE DRUG SCREEN, HOSP PERFORMED  PREGNANCY, URINE    EKG None  Radiology No results found.  Procedures Procedures (including critical care time)  Medications Ordered in ED Medications - No data to display   Initial Impression / Assessment and Plan / ED Course  I have reviewed the triage vital signs and the nursing notes.  Pertinent labs & imaging results that were available during my care of the patient were reviewed by me and considered in my medical decision making (see chart for details).  Clinical Course as of Sep 01 2118  Tue Sep 01, 2018  2119 38yo female brought in by EMS with SI. Labs positive for cocaine, alcohol 201. K 3.1, repleated orally. Patient evaluated by [facility], meets inpatient criteria.    [LM]    Clinical Course User Index [LM] Tacy Learn, PA-C      Final Clinical Impressions(s) / ED Diagnoses   Final diagnoses:  None    ED Discharge Orders    None       Bobbiejo, Ishikawa 09/01/18 2120    Virgel Manifold, MD 09/03/18 [phone]

## 2018-09-02 ENCOUNTER — Encounter (HOSPITAL_COMMUNITY): Payer: Self-pay

## 2018-09-02 ENCOUNTER — Inpatient Hospital Stay (HOSPITAL_COMMUNITY)
Admission: AD | Admit: 2018-09-02 | Discharge: 2018-09-06 | DRG: 885 | Disposition: A | Discharge status: Home / Self Care | Payer: Federal, State, Local not specified - Other | Source: Intra-hospital | Attending: Psychiatry | Admitting: Psychiatry

## 2018-09-02 DIAGNOSIS — F332 Major depressive disorder, recurrent severe without psychotic features: Principal | ICD-10-CM | POA: Diagnosis present

## 2018-09-02 DIAGNOSIS — G4700 Insomnia, unspecified: Secondary | ICD-10-CM | POA: Diagnosis present

## 2018-09-02 DIAGNOSIS — R45851 Suicidal ideations: Secondary | ICD-10-CM | POA: Diagnosis present

## 2018-09-02 DIAGNOSIS — Z20828 Contact with and (suspected) exposure to other viral communicable diseases: Secondary | ICD-10-CM | POA: Diagnosis present

## 2018-09-02 DIAGNOSIS — F17210 Nicotine dependence, cigarettes, uncomplicated: Secondary | ICD-10-CM | POA: Diagnosis present

## 2018-09-02 DIAGNOSIS — Y907 Blood alcohol level of 200-239 mg/100 ml: Secondary | ICD-10-CM | POA: Diagnosis present

## 2018-09-02 DIAGNOSIS — F329 Major depressive disorder, single episode, unspecified: Secondary | ICD-10-CM | POA: Diagnosis present

## 2018-09-02 DIAGNOSIS — F1120 Opioid dependence, uncomplicated: Secondary | ICD-10-CM | POA: Diagnosis present

## 2018-09-02 MED ORDER — MAGNESIUM HYDROXIDE 400 MG/5ML PO SUSP: 30.0000 mL | Freq: Every day | ORAL | Status: DC | PRN

## 2018-09-02 MED ORDER — ONDANSETRON 4 MG PO TBDP: 4.0000 mg | ORAL_TABLET | Freq: Four times a day (QID) | ORAL | Status: DC | PRN

## 2018-09-02 MED ORDER — ACETAMINOPHEN 325 MG PO TABS
650.0000 mg | ORAL_TABLET | Freq: Four times a day (QID) | ORAL | Status: DC | PRN
  Administered 2018-09-02: 650 mg via ORAL
  Filled 2018-09-02: qty 2

## 2018-09-02 MED ORDER — ACETAMINOPHEN 325 MG PO TABS
650.0000 mg | ORAL_TABLET | Freq: Once | ORAL | Status: AC
  Administered 2018-09-02: 650 mg via ORAL
  Filled 2018-09-02: qty 2

## 2018-09-02 MED ORDER — NICOTINE POLACRILEX 2 MG MT GUM
2.0000 mg | CHEWING_GUM | OROMUCOSAL | Status: DC | PRN
  Administered 2018-09-03 – 2018-09-06 (×8): 2 mg via ORAL
  Filled 2018-09-02 (×2): qty 1

## 2018-09-02 MED ORDER — CHLORDIAZEPOXIDE HCL 25 MG PO CAPS
25.0000 mg | ORAL_CAPSULE | Freq: Three times a day (TID) | ORAL | Status: AC
  Administered 2018-09-02 – 2018-09-03 (×4): 25 mg via ORAL
  Filled 2018-09-02 (×4): qty 1

## 2018-09-02 MED ORDER — HYDROXYZINE HCL 25 MG PO TABS
25.0000 mg | ORAL_TABLET | Freq: Four times a day (QID) | ORAL | Status: DC | PRN
  Administered 2018-09-03 – 2018-09-05 (×5): 25 mg via ORAL
  Filled 2018-09-02 (×5): qty 1
  Filled 2018-09-02: qty 10

## 2018-09-02 MED ORDER — TRAZODONE HCL 50 MG PO TABS
50.0000 mg | ORAL_TABLET | Freq: Every evening | ORAL | Status: DC | PRN
  Filled 2018-09-02: qty 7

## 2018-09-02 MED ORDER — NICOTINE 21 MG/24HR TD PT24
21.0000 mg | MEDICATED_PATCH | Freq: Every day | TRANSDERMAL | Status: DC
  Administered 2018-09-02: 21 mg via TRANSDERMAL
  Filled 2018-09-02 (×2): qty 1

## 2018-09-02 MED ORDER — NAPROXEN 500 MG PO TABS
500.0000 mg | ORAL_TABLET | Freq: Two times a day (BID) | ORAL | Status: DC | PRN
  Administered 2018-09-04: 21:00:00 500 mg via ORAL
  Filled 2018-09-02: qty 1

## 2018-09-02 MED ORDER — TEMAZEPAM 15 MG PO CAPS
30.0000 mg | ORAL_CAPSULE | Freq: Every day | ORAL | Status: DC
  Administered 2018-09-02 – 2018-09-05 (×4): 30 mg via ORAL
  Filled 2018-09-02 (×4): qty 2

## 2018-09-02 MED ORDER — GABAPENTIN 300 MG PO CAPS
300.0000 mg | ORAL_CAPSULE | Freq: Three times a day (TID) | ORAL | Status: DC
  Administered 2018-09-02 – 2018-09-06 (×13): 300 mg via ORAL
  Filled 2018-09-02 (×8): qty 1
  Filled 2018-09-02: qty 21
  Filled 2018-09-02 (×3): qty 1
  Filled 2018-09-02: qty 21
  Filled 2018-09-02 (×4): qty 1
  Filled 2018-09-02: qty 21
  Filled 2018-09-02: qty 1

## 2018-09-02 MED ORDER — ONDANSETRON 4 MG PO TBDP: 4.0000 mg | ORAL_TABLET | Freq: Once | ORAL | Status: DC

## 2018-09-02 MED ORDER — LOPERAMIDE HCL 2 MG PO CAPS: 2.0000 mg | ORAL_CAPSULE | ORAL | Status: DC | PRN

## 2018-09-02 MED ORDER — FLUOXETINE HCL 20 MG PO CAPS
20.0000 mg | ORAL_CAPSULE | Freq: Every day | ORAL | Status: DC
  Administered 2018-09-02 – 2018-09-06 (×5): 20 mg via ORAL
  Filled 2018-09-02 (×6): qty 1

## 2018-09-02 MED ORDER — ONDANSETRON 8 MG PO TBDP
8.0000 mg | ORAL_TABLET | Freq: Once | ORAL | Status: AC
  Administered 2018-09-02: 8 mg via ORAL
  Filled 2018-09-02: qty 1

## 2018-09-02 MED ORDER — ALUM & MAG HYDROXIDE-SIMETH 200-200-20 MG/5ML PO SUSP: 30.0000 mL | ORAL | Status: DC | PRN

## 2018-09-02 MED ORDER — METHOCARBAMOL 750 MG PO TABS
750.0000 mg | ORAL_TABLET | Freq: Three times a day (TID) | ORAL | Status: DC
  Administered 2018-09-02 – 2018-09-06 (×13): 750 mg via ORAL
  Filled 2018-09-02 (×15): qty 1

## 2018-09-02 MED ORDER — PRENATAL MULTIVITAMIN CH
1.0000 | ORAL_TABLET | Freq: Every day | ORAL | Status: DC
  Administered 2018-09-02 – 2018-09-06 (×5): 1 via ORAL
  Filled 2018-09-02 (×2): qty 1
  Filled 2018-09-02: qty 7
  Filled 2018-09-02 (×3): qty 1

## 2018-09-02 MED ORDER — DICYCLOMINE HCL 20 MG PO TABS: 20.0000 mg | ORAL_TABLET | Freq: Four times a day (QID) | ORAL | Status: DC | PRN

## 2018-09-02 NOTE — Tx Team (Signed)
Initial Treatment Plan 09/02/2018 12:26 PM April [name] [id number]    PATIENT STRESSORS: Medication change or noncompliance Substance abuse   PATIENT STRENGTHS: Average or above average intelligence Capable of independent living Motivation for treatment/growth Physical Health Supportive family/friends   PATIENT IDENTIFIED PROBLEMS: "stop feeling sad"  Suicide Risk  Substance Abuse                 DISCHARGE CRITERIA:  Ability to meet basic life and health needs Adequate post-discharge living arrangements Improved stabilization in mood, thinking, and/or behavior Medical problems require only outpatient monitoring Motivation to continue treatment in a less acute level of care Need for constant or close observation no longer present Reduction of life-threatening or endangering symptoms to within safe limits Safe-care adequate arrangements made Verbal commitment to aftercare and medication compliance Withdrawal symptoms are absent or subacute and managed without 24-hour nursing intervention  PRELIMINARY DISCHARGE PLAN: Outpatient therapy  PATIENT/FAMILY INVOLVEMENT: This treatment plan has been presented to and reviewed with the patient, April [name].  The patient and family have been given the opportunity to ask questions and make suggestions.  Annia Friendly, RN 09/02/2018, 12:26 PM

## 2018-09-02 NOTE — [facility] Suicide Risk Assessment (Signed)
[facility] Admission Suicide Risk Assessment    Total Time spent with patient: 45 Principal Problem: <principal problem not specified> Diagnosis:  Active Problems:   MDD (major depressive disorder)  Subjective Data: Admitted for detox and stabilization  Continued Clinical Symptoms:    The "Alcohol Use Disorders Identification Test", Guidelines for Use in Primary Care, Second Edition.  [employer] [facility]). Score between 0-7:  no or low risk or alcohol related problems. Score between 8-15:  moderate risk of alcohol related problems. Score between 16-19:  high risk of alcohol related problems. Score 20 or above:  warrants further diagnostic evaluation for alcohol dependence and treatment.   CLINICAL FACTORS:   Dysthymia  Musculoskeletal: Strength & Muscle Tone: within normal limits Gait & Station: normal Patient leans: N/A  Psychiatric Specialty Exam: Physical Exam  Nursing note and vitals reviewed. Constitutional: She appears well-developed and well-nourished.  Cardiovascular: Normal rate and regular rhythm.    Review of Systems  Constitutional: Negative.   Cardiovascular: Negative.   Genitourinary: Negative.   Neurological: Negative.   Endo/Heme/Allergies: Negative.     Last menstrual period 08/07/2018.There is no height or weight on file to calculate BMI.  General Appearance: Disheveled  Eye Contact:  Good  Speech:  Pressured  Volume:  Normal  Mood:  Anxious and Depressed  Affect:  Tearful  Thought Process:  Linear and Descriptions of Associations: Intact  Orientation:  Full (Time, Place, and Person)  Thought Content:  Logical  Suicidal Thoughts:  Yes.  without intent/plan  Homicidal Thoughts:  No  Memory:  Immediate;   Fair  Judgement:  Fair  Insight:  Fair  Psychomotor Activity:  Normal  Concentration:  Concentration: Good  Recall:  Good  Fund of Knowledge:  Good  Language:  Good  Akathisia:  Negative  Handed:  Right  AIMS (if indicated):      Assets:  Communication Skills Desire for Improvement  ADL's:  Intact  Cognition:  WNL  Sleep:       COGNITIVE FEATURES THAT CONTRIBUTE TO RISK:  Polarized thinking and Thought constriction (tunnel vision)    SUICIDE RISK:   Moderate:  Frequent suicidal ideation with limited intensity, and duration, some specificity in terms of plans, no associated intent, good self-control, limited dysphoria/symptomatology, some risk factors present, and identifiable protective factors, including available and accessible social support.  PLAN OF CARE: detox- and txt MDD  I certify that inpatient services furnished can reasonably be expected to improve the patient's condition.   Johnn Hai, MD 09/02/2018, 12:10 PM

## 2018-09-02 NOTE — H&P (Signed)
Psychiatric Admission Assessment Adult  Patient Identification: April SpillersLaura A [name] MRN:  [id number] Date of Evaluation:  09/02/2018 Chief Complaint:  mdd Principal Diagnosis: <principal problem not specified> Diagnosis:  Active Problems:   MDD (major depressive disorder)  History of Present Illness:   This is the first admission here however the second chemical dependency/depression related admission overall for April [name], she is 38 years of age and again has not been admitted in 5 years. Prior admission was at [facility] she states at that point in time she needed detox but was not admitting to it and was in denial and sought treatment and instead for just depression.  She presented here after found lying on the side of the road EMS arrived found her intoxicated she stated she also took some Benadryl and acknowledged suicidal thoughts, recurrent depression so forth.   She is here now continuing to request detox, stating she is dependent on opiates she states when she runs out she will obtain Suboxone which "helps a little".  She reports a history of chronic depression with past treatment with Prozac, Paxil, Zoloft, and many others to include venlafaxine which she felt made her feel like a zombie.  She reports suicidal thoughts that come and go without current plans or intent.  She also reports she has been living with her boyfriend who also abuses opiates and she cannot go back there but she will be staying with a friend.  She has not worked since February and she used to work at a [employer] and really misses it because it kept her busy and of course that limited her substance use.  She states she has been also abusing cocaine and cannabis.   Other symptoms include depression, mood swings where she will cry 1 minute and the angry the next, feeling like her skin is crawling, anhedonia, insomnia, and poor frustration tolerance.  She denies psychotic symptoms or seizures. Blood alcohol level  201, drug screen positive for cocaine   associated Signs/Symptoms: Depression Symptoms:  insomnia, (Hypo) Manic Symptoms:  Distractibility, Anxiety Symptoms:  Excessive Worry, Psychotic Symptoms:  n/a PTSD Symptoms: Had a traumatic exposure:  did not elab Total Time spent with patient: 45 minutes  Past Psychiatric History: As above 1 prior admission at [facility] numerous antidepressant trials  Is the patient at risk to self? Yes.    Has the patient been a risk to self in the past 6 months? Yes.    Has the patient been a risk to self within the distant past? Yes.    Is the patient a risk to others? No.  Has the patient been a risk to others in the past 6 months? No.  Has the patient been a risk to others within the distant past? No.   Alcohol Screening:   Substance Abuse History in the last 12 months:  Yes.   Consequences of Substance Abuse: Medical Consequences:  Requiring detox Previous Psychotropic Medications: Yes  Psychological Evaluations: No  Past Medical History:  Past Medical History:  Diagnosis Date  . Anxiety   . Bronchitis   . Depression     Past Surgical History:  Procedure Laterality Date  . ANKLE SURGERY     left   Family History:  Family History  Problem Relation Age of Onset  . Hypertension Mother   . Diabetes Other   . Cancer Other   . Hypertension Other   Tobacco Screening:   Social History:  Social History   Substance and Sexual  Activity  Alcohol Use Yes   Comment: daily     Social History   Substance and Sexual Activity  Drug Use No    Additional Social History:                           Allergies:   Allergies  Allergen Reactions  . Other Hives    Cashews   Lab Results:  Results for orders placed or performed during the hospital encounter of 09/01/18 (from the past 48 hour(s))  Comprehensive metabolic panel     Status: Abnormal   Collection Time: 09/01/18  6:34 PM  Result Value Ref Range   Sodium 140 135 -  145 mmol/L   Potassium 3.1 (L) 3.5 - 5.1 mmol/L   Chloride 106 98 - 111 mmol/L   CO2 23 22 - 32 mmol/L   Glucose, Bld 117 (H) 70 - 99 mg/dL   BUN 10 6 - 20 mg/dL   Creatinine, Ser 0.980.53 0.44 - 1.00 mg/dL   Calcium 7.9 (L) 8.9 - 10.3 mg/dL   Total Protein 7.0 6.5 - 8.1 g/dL   Albumin 3.9 3.5 - 5.0 g/dL   AST 14 (L) 15 - 41 U/L   ALT 11 0 - 44 U/L   Alkaline Phosphatase 45 38 - 126 U/L   Total Bilirubin 0.6 0.3 - 1.2 mg/dL   GFR calc non Af Amer >60 >60 mL/min   GFR calc Af Amer >60 >60 mL/min   Anion gap 11 5 - 15    Comment: Performed at [facility], [address] [place], [place] [address]  Ethanol     Status: Abnormal   Collection Time: 09/01/18  6:34 PM  Result Value Ref Range   Alcohol, Ethyl (B) 201 (H) <10 mg/dL    Comment: (NOTE) Lowest detectable limit for serum alcohol is 10 mg/dL. For medical purposes only. Performed at [facility], [address] [place], [place] [address]   Salicylate level     Status: None   Collection Time: 09/01/18  6:34 PM  Result Value Ref Range   Salicylate Lvl <7.0 2.8 - 30.0 mg/dL    Comment: Performed at [facility], [address] [place], [place] [address]  Acetaminophen level     Status: Abnormal   Collection Time: 09/01/18  6:34 PM  Result Value Ref Range   Acetaminophen (Tylenol), Serum <10 (L) 10 - 30 ug/mL    Comment: (NOTE) Therapeutic concentrations vary significantly. A range of 10-30 ug/mL  may be an effective concentration for many patients. However, some  are best treated at concentrations outside of this range. Acetaminophen concentrations >150 ug/mL at 4 hours after ingestion  and >50 ug/mL at 12 hours after ingestion are often associated with  toxic reactions. Performed at [facility], [address] [place], [place] [address]   cbc     Status: Abnormal   Collection Time: 09/01/18  6:34 PM  Result Value Ref Range   WBC 12.9 (H) 4.0 - 10.5 K/uL   RBC 4.10 3.87 - 5.11 MIL/uL   Hemoglobin 12.8 12.0 - 15.0 g/dL    HCT 46.939.4 62.936.0 - 52.846.0 %   MCV 96.1 80.0 - 100.0 fL   MCH 31.2 26.0 - 34.0 pg   MCHC 32.5 30.0 - 36.0 g/dL   RDW 41.312.6 24.411.5 - 01.015.5 %   Platelets 303 150 - 400 K/uL   nRBC 0.0 0.0 - 0.2 %    Comment: Performed  at [facility], [address] [place], [place] [address]  Rapid urine drug screen (hospital performed)     Status: Abnormal   Collection Time: 09/01/18  8:00 PM  Result Value Ref Range   Opiates NONE DETECTED NONE DETECTED   Cocaine POSITIVE (A) NONE DETECTED   Benzodiazepines NONE DETECTED NONE DETECTED   Amphetamines NONE DETECTED NONE DETECTED   Tetrahydrocannabinol NONE DETECTED NONE DETECTED   Barbiturates NONE DETECTED NONE DETECTED    Comment: (NOTE) DRUG SCREEN FOR MEDICAL PURPOSES ONLY.  IF CONFIRMATION IS NEEDED FOR ANY PURPOSE, NOTIFY LAB WITHIN 5 DAYS. LOWEST DETECTABLE LIMITS FOR URINE DRUG SCREEN Drug Class                     Cutoff (ng/mL) Amphetamine and metabolites    1000 Barbiturate and metabolites    200 Benzodiazepine                 200 Tricyclics and metabolites     300 Opiates and metabolites        300 Cocaine and metabolites        300 THC                            50 Performed at [facility], [address] [place], [place] [address]   Pregnancy, urine     Status: None   Collection Time: 09/01/18  8:00 PM  Result Value Ref Range   Preg Test, Ur NEGATIVE NEGATIVE    Comment:        THE SENSITIVITY OF THIS METHODOLOGY IS >20 mIU/mL. Performed at [facility], [address] [place], [place] [address]   SARS Coronavirus 2 [facility](Hospital order, Performed in [facility] hospital lab) Nasopharyngeal Nasopharyngeal Swab     Status: None   Collection Time: 09/01/18  9:16 PM   Specimen: Nasopharyngeal Swab  Result Value Ref Range   SARS Coronavirus 2 NEGATIVE NEGATIVE    Comment: (NOTE) If result is NEGATIVE SARS-CoV-2 target nucleic acids are NOT DETECTED. The SARS-CoV-2 RNA is generally detectable in upper and lower  respiratory specimens  during the acute phase of infection. The lowest  concentration of SARS-CoV-2 viral copies this assay can detect is 250  copies / mL. A negative result does not preclude SARS-CoV-2 infection  and should not be used as the sole basis for treatment or other  patient management decisions.  A negative result may occur with  improper specimen collection / handling, submission of specimen other  than nasopharyngeal swab, presence of viral mutation(s) within the  areas targeted by this assay, and inadequate number of viral copies  (<250 copies / mL). A negative result must be combined with clinical  observations, patient history, and epidemiological information. If result is POSITIVE SARS-CoV-2 target nucleic acids are DETECTED. The SARS-CoV-2 RNA is generally detectable in upper and lower  respiratory specimens dur ing the acute phase of infection.  Positive  results are indicative of active infection with SARS-CoV-2.  Clinical  correlation with patient history and other diagnostic information is  necessary to determine patient infection status.  Positive results do  not rule out bacterial infection or co-infection with other viruses. If result is PRESUMPTIVE POSTIVE SARS-CoV-2 nucleic acids MAY BE PRESENT.   A presumptive positive result was obtained on the submitted specimen  and confirmed on repeat testing.  While 2019 novel coronavirus  (SARS-CoV-2) nucleic acids may be present in the submitted  sample  additional confirmatory testing may be necessary for epidemiological  and / or clinical management purposes  to differentiate between  SARS-CoV-2 and other Sarbecovirus currently known to infect humans.  If clinically indicated additional testing with an alternate test  methodology [phone]) is advised. The SARS-CoV-2 RNA is generally  detectable in upper and lower respiratory sp ecimens during the acute  phase of infection. The expected result is Negative. Fact Sheet for Patients:   StrictlyIdeas.no Fact Sheet for Healthcare Providers: BankingDealers.co.za This test is not yet approved or cleared by the [place] FDA and has been authorized for detection and/or diagnosis of SARS-CoV-2 by FDA under an Emergency Use Authorization (EUA).  This EUA will remain in effect (meaning this test can be used) for the duration of the COVID-19 declaration under Section 564(b)(1) of the Act, 21 U.S.C. section 360bbb-3(b)(1), unless the authorization is terminated or revoked sooner. Performed at [facility], [address], [place], [place] [address]     Blood Alcohol level:  Lab Results  Component Value Date   ETH 201 (H) 09/01/2018   ETH <11 81/19/1478    Metabolic Disorder Labs:  No results found for: HGBA1C, MPG No results found for: PROLACTIN No results found for: CHOL, TRIG, HDL, CHOLHDL, VLDL, LDLCALC  Current Medications: Current Facility-Administered Medications  Medication Dose Route Frequency Provider Last Rate Last Dose  . acetaminophen (TYLENOL) tablet 650 mg  650 mg Oral Q6H PRN Dixon, Rashaun M, NP      . alum & mag hydroxide-simeth (MAALOX/MYLANTA) 200-200-20 MG/5ML suspension 30 mL  30 mL Oral Q4H PRN Dixon, Rashaun M, NP      . dicyclomine (BENTYL) tablet 20 mg  20 mg Oral Q6H PRN Johnn Hai, MD      . FLUoxetine (PROZAC) capsule 20 mg  20 mg Oral Daily Johnn Hai, MD      . gabapentin (NEURONTIN) capsule 300 mg  300 mg Oral TID Johnn Hai, MD      . hydrOXYzine (ATARAX/VISTARIL) tablet 25 mg  25 mg Oral Q6H PRN Johnn Hai, MD      . loperamide (IMODIUM) capsule 2-4 mg  2-4 mg Oral PRN Johnn Hai, MD      . magnesium hydroxide (MILK OF MAGNESIA) suspension 30 mL  30 mL Oral Daily PRN Deloria Lair, NP      . methocarbamol (ROBAXIN) tablet 750 mg  750 mg Oral TID Johnn Hai, MD      . naproxen (NAPROSYN) tablet 500 mg  500 mg Oral BID PRN Johnn Hai, MD      . ondansetron (ZOFRAN-ODT)  disintegrating tablet 4 mg  4 mg Oral Q6H PRN Johnn Hai, MD      . prenatal vitamin w/FE, FA (NATACHEW) chewable tablet 1 tablet  1 tablet Oral Q1200 Johnn Hai, MD      . temazepam (RESTORIL) capsule 30 mg  30 mg Oral QHS Johnn Hai, MD      . traZODone (DESYREL) tablet 50 mg  50 mg Oral QHS PRN Deloria Lair, NP       PTA Medications: Medications Prior to Admission  Medication Sig Dispense Refill Last Dose  . albuterol (PROVENTIL HFA;VENTOLIN HFA) 108 (90 BASE) MCG/ACT inhaler Inhale 2 puffs into the lungs every 6 (six) hours as needed. For bronchitis      . diphenhydrAMINE (BENADRYL) 25 mg capsule Take 25 mg by mouth daily as needed for sleep.       Musculoskeletal: Strength & Muscle Tone: within normal  limits Gait & Station: normal Patient leans: N/A  Psychiatric Specialty Exam: Physical Exam  Nursing note and vitals reviewed. Constitutional: She appears well-developed and well-nourished.  Cardiovascular: Normal rate and regular rhythm.    Review of Systems  Constitutional: Negative.   Cardiovascular: Negative.   Genitourinary: Negative.   Neurological: Negative.   Endo/Heme/Allergies: Negative.     Last menstrual period 08/07/2018.There is no height or weight on file to calculate BMI.  General Appearance: Disheveled  Eye Contact:  Good  Speech:  Pressured  Volume:  Normal  Mood:  Anxious and Depressed  Affect:  Tearful  Thought Process:  Linear and Descriptions of Associations: Intact  Orientation:  Full (Time, Place, and Person)  Thought Content:  Logical  Suicidal Thoughts:  Yes.  without intent/plan  Homicidal Thoughts:  No  Memory:  Immediate;   Fair  Judgement:  Fair  Insight:  Fair  Psychomotor Activity:  Normal  Concentration:  Concentration: Good  Recall:  Good  Fund of Knowledge:  Good  Language:  Good  Akathisia:  Negative  Handed:  Right  AIMS (if indicated):     Assets:  Communication Skills Desire for Improvement  ADL's:  Intact   Cognition:  WNL  Sleep:       Treatment Plan Summary: Daily contact with patient to assess and evaluate symptoms and progress in treatment and Medication management  Observation Level/Precautions:  15 minute checks  Laboratory:  UDS  Psychotherapy: Cognitive and rehab based  Medications: Begin detox regimen  Consultations: Not necessary  Discharge Concerns: Long-term sobriety and stability  Estimated LOS: 5-7  Other: 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   Physician Treatment Plan for Primary Diagnosis: <principal problem not specified> Long Term Goal(s): Improvement in symptoms so as ready for discharge  Short Term Goals: Ability to demonstrate self-control will improve, Ability to identify and develop effective coping behaviors will improve, Ability to maintain clinical measurements within normal limits will improve and Compliance with prescribed medications will improve  Physician Treatment Plan for Secondary Diagnosis: Active Problems:   MDD (major depressive disorder)  Long Term Goal(s): Improvement in symptoms so as ready for discharge  Short Term Goals: Ability to identify and develop effective coping behaviors will improve, Ability to maintain clinical measurements within normal limits will improve and Compliance with prescribed medications will improve  I certify that inpatient services furnished can reasonably be expected to improve the patient's condition.    Malvin Johns, MD 8/12/202012:03 PM

## 2018-09-02 NOTE — ED Notes (Addendum)
[facility] accepted  Rm [id number]. Accepted provider Dr Mallie Darting

## 2018-09-02 NOTE — ED Notes (Signed)
Pt eating breakfast . Denies SI thoughts at this time, very tearful

## 2018-09-02 NOTE — ED Notes (Signed)
[facility] here to transport pt to [facility] 

## 2018-09-02 NOTE — Progress Notes (Signed)
Patient ID: April [name], female   DOB: 05/12/1980, 38 y.o.   MRN: [id number]  Admission Note  D) Patient admitted to the adult unit 300 hall. Patient is a 38 year old female who is voluntary from [facility]. Patient presents tearful and anxious but was pleasant and cooperative during the admission process. Patient states she blacked out after drinking alcohol and "someone called EMS". Patient states she has been abusing cocaine, suboxone and alcohol. Patient reports two previous SI attempts and one previous hospitalization at [facility] 8 years ago. Patient states she stopped all psych medications 4 years ago when she lost her Medicaid. Patient states her mom and 64 year old son are her support systems. Patient reports SI since she was 38 years old but has no current plan or intent to harm self. Patient denies HI/AVH. Patient denies legal issues.   Skin assessment was completed and unremarkable except for a scab to her L knee and general small bruises from "falling down". Patient belongings searched with no contraband found. Belongings in locker #50. Vital signs obtained. Patient's cell phone is shattered and the battery has fallen out upon admission, patient verbalizes this is from smashing it before she blacked out. Cell phone will not turn on and is essentially not functional and the screen is completely shattered.   A) Plan of care, unit policies and patient expectations were explained. Written consents obtained. Patient oriented to the unit and their room. Patient placed on standard q15 safety checks. High fall risk precautions initiated and reviewed with patient.   R) Patient is in no acute distress and verbalizes understanding of information provided. Patient with no concerns at this time. Patient contracts for safety with staff on the unit.

## 2018-09-02 NOTE — ED Notes (Signed)
ED TO INPATIENT HANDOFF REPORT  ED Nurse Name and Phone #: Osie Cheeks Name/Age/Gender April [name] 38 y.o. female Room/Bed: APA17/APA17  Code Status   Code Status: Prior  Home/SNF/Other Home Patient oriented to: self and place Is this baseline? Yes   Triage Complete: Triage complete  Chief Complaint Medical Clearance   Triage Note Pt brought in by EMS. EMS was called out because pt was lying on the side of the road. When EMS arrived, pt was sitting on side of the road. Pt admitted to ETOH use which she used to self medicate for her anxiety. Pt states she also took Benadryl to help her sleep. Pt admits to SI, but was not trying to hurt herself today.Pt has hx of depression and anxiety, but has not had any medications to treat for over 3 years. EMS states pt initial temp was 99.8, but after giving her an ice pack and bringing her in truck, pt's temp decreased to 98.0. Pt calm and cooperative at this time.   Allergies Allergies  Allergen Reactions  . Other Hives    Cashews    Level of Care/Admitting Diagnosis ED Disposition    None      B Medical/Surgery History Past Medical History:  Diagnosis Date  . Anxiety   . Bronchitis   . Depression    Past Surgical History:  Procedure Laterality Date  . ANKLE SURGERY     left     A IV Location/Drains/Wounds Patient Lines/Drains/Airways Status   Active Line/Drains/Airways    None          Intake/Output Last 24 hours No intake or output data in the 24 hours ending 09/02/18 0947  Labs/Imaging Results for orders placed or performed during the hospital encounter of 09/01/18 (from the past 48 hour(s))  Comprehensive metabolic panel     Status: Abnormal   Collection Time: 09/01/18  6:34 PM  Result Value Ref Range   Sodium 140 135 - 145 mmol/L   Potassium 3.1 (L) 3.5 - 5.1 mmol/L   Chloride 106 98 - 111 mmol/L   CO2 23 22 - 32 mmol/L   Glucose, Bld 117 (H) 70 - 99 mg/dL   BUN 10 6 - 20 mg/dL   Creatinine, Ser  0.53 0.44 - 1.00 mg/dL   Calcium 7.9 (L) 8.9 - 10.3 mg/dL   Total Protein 7.0 6.5 - 8.1 g/dL   Albumin 3.9 3.5 - 5.0 g/dL   AST 14 (L) 15 - 41 U/L   ALT 11 0 - 44 U/L   Alkaline Phosphatase 45 38 - 126 U/L   Total Bilirubin 0.6 0.3 - 1.2 mg/dL   GFR calc non Af Amer >60 >60 mL/min   GFR calc Af Amer >60 >60 mL/min   Anion gap 11 5 - 15    Comment: Performed at [facility], [address], [place], [place] [address]  Ethanol     Status: Abnormal   Collection Time: 09/01/18  6:34 PM  Result Value Ref Range   Alcohol, Ethyl (B) 201 (H) <10 mg/dL    Comment: (NOTE) Lowest detectable limit for serum alcohol is 10 mg/dL. For medical purposes only. Performed at [facility], [address] [place], [place] [address]   Salicylate level     Status: None   Collection Time: 09/01/18  6:34 PM  Result Value Ref Range   Salicylate Lvl <0.7 2.8 - 30.0 mg/dL    Comment: Performed at [facility], [address] [place],  [place] [address]  Acetaminophen level     Status: Abnormal   Collection Time: 09/01/18  6:34 PM  Result Value Ref Range   Acetaminophen (Tylenol), Serum <10 (L) 10 - 30 ug/mL    Comment: (NOTE) Therapeutic concentrations vary significantly. A range of 10-30 ug/mL  may be an effective concentration for many patients. However, some  are best treated at concentrations outside of this range. Acetaminophen concentrations >150 ug/mL at 4 hours after ingestion  and >50 ug/mL at 12 hours after ingestion are often associated with  toxic reactions. Performed at [facility], [address] [place], [place] [address]   cbc     Status: Abnormal   Collection Time: 09/01/18  6:34 PM  Result Value Ref Range   WBC 12.9 (H) 4.0 - 10.5 K/uL   RBC 4.10 3.87 - 5.11 MIL/uL   Hemoglobin 12.8 12.0 - 15.0 g/dL   HCT 09.839.4 11.936.0 - 14.746.0 %   MCV 96.1 80.0 - 100.0 fL   MCH 31.2 26.0 - 34.0 pg   MCHC 32.5 30.0 - 36.0 g/dL   RDW 82.912.6 56.211.5 - 13.015.5 %   Platelets 303 150 - 400 K/uL   nRBC 0.0 0.0 -  0.2 %    Comment: Performed at [facility], [address] [place], [place] [address]  Rapid urine drug screen (hospital performed)     Status: Abnormal   Collection Time: 09/01/18  8:00 PM  Result Value Ref Range   Opiates NONE DETECTED NONE DETECTED   Cocaine POSITIVE (A) NONE DETECTED   Benzodiazepines NONE DETECTED NONE DETECTED   Amphetamines NONE DETECTED NONE DETECTED   Tetrahydrocannabinol NONE DETECTED NONE DETECTED   Barbiturates NONE DETECTED NONE DETECTED    Comment: (NOTE) DRUG SCREEN FOR MEDICAL PURPOSES ONLY.  IF CONFIRMATION IS NEEDED FOR ANY PURPOSE, NOTIFY LAB WITHIN 5 DAYS. LOWEST DETECTABLE LIMITS FOR URINE DRUG SCREEN Drug Class                     Cutoff (ng/mL) Amphetamine and metabolites    1000 Barbiturate and metabolites    200 Benzodiazepine                 200 Tricyclics and metabolites     300 Opiates and metabolites        300 Cocaine and metabolites        300 THC                            50 Performed at [facility], [address] [place], [place] [address]   Pregnancy, urine     Status: None   Collection Time: 09/01/18  8:00 PM  Result Value Ref Range   Preg Test, Ur NEGATIVE NEGATIVE    Comment:        THE SENSITIVITY OF THIS METHODOLOGY IS >20 mIU/mL. Performed at [facility], [address] [place], [place] [address]   SARS Coronavirus 2 [facility](Hospital order, Performed in [facility] hospital lab) Nasopharyngeal Nasopharyngeal Swab     Status: None   Collection Time: 09/01/18  9:16 PM   Specimen: Nasopharyngeal Swab  Result Value Ref Range   SARS Coronavirus 2 NEGATIVE NEGATIVE    Comment: (NOTE) If result is NEGATIVE SARS-CoV-2 target nucleic acids are NOT DETECTED. The SARS-CoV-2 RNA is generally detectable in upper and lower  respiratory specimens during the acute phase of infection. The lowest  concentration of SARS-CoV-2 viral copies  this assay can detect is 250  copies / mL. A negative result does not preclude SARS-CoV-2  infection  and should not be used as the sole basis for treatment or other  patient management decisions.  A negative result may occur with  improper specimen collection / handling, submission of specimen other  than nasopharyngeal swab, presence of viral mutation(s) within the  areas targeted by this assay, and inadequate number of viral copies  (<250 copies / mL). A negative result must be combined with clinical  observations, patient history, and epidemiological information. If result is POSITIVE SARS-CoV-2 target nucleic acids are DETECTED. The SARS-CoV-2 RNA is generally detectable in upper and lower  respiratory specimens dur ing the acute phase of infection.  Positive  results are indicative of active infection with SARS-CoV-2.  Clinical  correlation with patient history and other diagnostic information is  necessary to determine patient infection status.  Positive results do  not rule out bacterial infection or co-infection with other viruses. If result is PRESUMPTIVE POSTIVE SARS-CoV-2 nucleic acids MAY BE PRESENT.   A presumptive positive result was obtained on the submitted specimen  and confirmed on repeat testing.  While 2019 novel coronavirus  (SARS-CoV-2) nucleic acids may be present in the submitted sample  additional confirmatory testing may be necessary for epidemiological  and / or clinical management purposes  to differentiate between  SARS-CoV-2 and other Sarbecovirus currently known to infect humans.  If clinically indicated additional testing with an alternate test  methodology [phone]([id number]) is advised. The SARS-CoV-2 RNA is generally  detectable in upper and lower respiratory sp ecimens during the acute  phase of infection. The expected result is Negative. Fact Sheet for Patients:  BoilerBrush.com.cyhttps://www.fda.gov/media/136312/download Fact Sheet for Healthcare Providers: https://pope.com/https://www.fda.gov/media/136313/download This test is not yet approved or cleared by the [place]  FDA and has been authorized for detection and/or diagnosis of SARS-CoV-2 by FDA under an Emergency Use Authorization (EUA).  This EUA will remain in effect (meaning this test can be used) for the duration of the COVID-19 declaration under Section 564(b)(1) of the Act, 21 U.S.C. section 360bbb-3(b)(1), unless the authorization is terminated or revoked sooner. Performed at [facility], [address] [place], [place] [address]    No results found.  Pending Labs Unresulted Labs (From admission, onward)   None      Vitals/Pain Today's Vitals   09/01/18 1810 09/01/18 1814 09/02/18 0526 09/02/18 0633  BP:  134/88  124/77  Pulse:  87  76  Resp:  16  16  Temp:  98.8 F (37.1 C)  99.1 F (37.3 C)  TempSrc:  Oral  Oral  SpO2:  100%  96%  Weight: 58.1 kg     Height: 5\' 4"  (1.626 m)     PainSc:   0-No pain     Isolation Precautions Airborne and Contact precautions  Medications Medications  potassium chloride SA (K-DUR) CR tablet 40 mEq (40 mEq Oral Given 09/01/18 2143)  acetaminophen (TYLENOL) tablet 650 mg (650 mg Oral Given 09/02/18 0308)  ondansetron (ZOFRAN-ODT) disintegrating tablet 8 mg (8 mg Oral Given 09/02/18 0200)    Mobility walks Low fall risk   Focused Assessments    R Recommendations: See Admitting Provider Note  Report given to:   Additional Notes:Voluntary

## 2018-09-02 NOTE — ED Notes (Signed)
Voluntary consent signed

## 2018-09-02 NOTE — BH Assessment (Addendum)
Per [facility], Pt has been accepted to [facility], bed 307-1 Accepting provider Dr. Myles Lipps.   Pt is voluntary and will be transported by [employer] transport.  Patient can arrive at anytime.   Please call report to [phone].  Angelina Pih, RN aware.    Radonna Ricker, MSW, [place] Social Worker [facility]  Phone: [phone]

## 2018-09-02 NOTE — Plan of Care (Signed)
D: Denies SI, HI, AVH, and verbally contracts for safety.    A: Medications administered per MD order. Support provided. Patient educated on safety on the unit and medications. Routine safety checks every 15 minutes. Patient stated understanding to tell nurse about any new physical symptoms. Patient understands to tell staff of any needs.     R: No adverse drug reactions noted. Patient verbally contracts for safety. Patient remains safe at this time and will continue to monitor.   Problem: Education: Goal: Knowledge of [facility] Education information/materials will improve Outcome: Progressing   Problem: Safety: Goal: Periods of time without injury will increase Outcome: Progressing   [place] NOVEL CORONAVIRUS (COVID-19) DAILY CHECK-OFF SYMPTOMS - answer yes or no to each - every day NO YES  Have you had a fever in the past 24 hours?  Fever (Temp > 37.80C / 100F) X   Have you had any of these symptoms in the past 24 hours? New Cough  Sore Throat   Shortness of Breath  Difficulty Breathing  Unexplained Body Aches   X   Have you had any one of these symptoms in the past 24 hours not related to allergies?   Runny Nose  Nasal Congestion  Sneezing   X   If you have had runny nose, nasal congestion, sneezing in the past 24 hours, has it worsened?  X   EXPOSURES - check yes or no X   Have you traveled outside the state in the past 14 days?  X   Have you been in contact with someone with a confirmed diagnosis of COVID-19 or PUI in the past 14 days without wearing appropriate PPE?  X   Have you been living in the same home as a person with confirmed diagnosis of COVID-19 or a PUI (household contact)?    X   Have you been diagnosed with COVID-19?    X              What to do next: Answered NO to all: Answered YES to anything:   Proceed with unit schedule Follow the [facility] Inpatient Flowsheet.    

## 2018-09-03 LAB — CBC
HCT: 41.8 % (ref 36.0–46.0)
Hemoglobin: 13.7 g/dL (ref 12.0–15.0)
MCH: 31.8 pg (ref 26.0–34.0)
MCHC: 32.8 g/dL (ref 30.0–36.0)
MCV: 97 fL (ref 80.0–100.0)
Platelets: 318 10*3/uL (ref 150–400)
RBC: 4.31 MIL/uL (ref 3.87–5.11)
RDW: 12.4 % (ref 11.5–15.5)
WBC: 6.7 10*3/uL (ref 4.0–10.5)
nRBC: 0 % (ref 0.0–0.2)

## 2018-09-03 LAB — HEMOGLOBIN A1C
Hgb A1c MFr Bld: 5.1 % (ref 4.8–5.6)
Mean Plasma Glucose: 99.67 mg/dL

## 2018-09-03 MED ORDER — CHLORDIAZEPOXIDE HCL 5 MG PO CAPS
10.0000 mg | ORAL_CAPSULE | Freq: Three times a day (TID) | ORAL | Status: AC
  Administered 2018-09-03 – 2018-09-04 (×2): 10 mg via ORAL
  Filled 2018-09-03 (×2): qty 2

## 2018-09-03 NOTE — Progress Notes (Signed)
[facility]  09/03/2018 8:22 AM April SpillersLaura A [name]  MRN:  [id number] Subjective:    Patient is doing better as far as her withdrawal symptoms no cravings and no tremors her mood is even better and she is not tearful today but she does not feel ready for release can contract for safety here.  Vitals are stable.  Principal Problem: Opiate dependency then switching to alcohol to try and reduce her opiate usage and then abuse of cocaine as well as substance-induced mood disorder depressed type and underlying dysthymia. Diagnosis: Active Problems:   MDD (major depressive disorder)  Total Time spent with patient: 20 minutes  Past Medical History:  Past Medical History:  Diagnosis Date  . Anxiety   . Bronchitis   . Depression     Past Surgical History:  Procedure Laterality Date  . ANKLE SURGERY     left   Family History:  Family History  Problem Relation Age of Onset  . Hypertension Mother   . Diabetes Other   . Cancer Other   . Hypertension Other     Social History:  Social History   Substance and Sexual Activity  Alcohol Use Yes   Comment: daily     Social History   Substance and Sexual Activity  Drug Use Yes  . Types: Cocaine    Social History   Socioeconomic History  . Marital status: Single    Spouse name: Not on file  . Number of children: Not on file  . Years of education: Not on file  . Highest education level: Not on file  Occupational History  . Not on file  Social Needs  . Financial resource strain: Not on file  . Food insecurity    Worry: Not on file    Inability: Not on file  . Transportation needs    Medical: Not on file    Non-medical: Not on file  Tobacco Use  . Smoking status: Current Every Day Smoker    Packs/day: 0.05    Years: 12.00    Pack years: 0.60    Types: Cigarettes  . Smokeless tobacco: Never Used  Substance and Sexual Activity  . Alcohol use: Yes    Comment: daily  . Drug use: Yes    Types: Cocaine  . Sexual  activity: Not Currently  Lifestyle  . Physical activity    Days per week: Not on file    Minutes per session: Not on file  . Stress: Not on file  Relationships  . Social Musicianconnections    Talks on phone: Not on file    Gets together: Not on file    Attends religious service: Not on file    Active member of club or organization: Not on file    Attends meetings of clubs or organizations: Not on file    Relationship status: Not on file  Other Topics Concern  . Not on file  Social History Narrative  . Not on file   Additional Social History:                         Sleep: Good  Appetite:  Good  Current Medications: Current Facility-Administered Medications  Medication Dose Route Frequency Provider Last Rate Last Dose  . acetaminophen (TYLENOL) tablet 650 mg  650 mg Oral Q6H PRN Jearld Leschixon, Rashaun M, NP   650 mg at 09/02/18 1831  . alum & mag hydroxide-simeth (MAALOX/MYLANTA) 200-200-20 MG/5ML suspension 30 mL  30  mL Oral Q4H PRN Deloria Lair, NP      . chlordiazePOXIDE (LIBRIUM) capsule 10 mg  10 mg Oral TID Johnn Hai, MD      . chlordiazePOXIDE (LIBRIUM) capsule 25 mg  25 mg Oral TID Johnn Hai, MD   25 mg at 09/03/18 0747  . dicyclomine (BENTYL) tablet 20 mg  20 mg Oral Q6H PRN Johnn Hai, MD      . FLUoxetine (PROZAC) capsule 20 mg  20 mg Oral Daily Johnn Hai, MD   20 mg at 09/03/18 0747  . gabapentin (NEURONTIN) capsule 300 mg  300 mg Oral TID Johnn Hai, MD   300 mg at 09/03/18 0747  . hydrOXYzine (ATARAX/VISTARIL) tablet 25 mg  25 mg Oral Q6H PRN Johnn Hai, MD      . loperamide (IMODIUM) capsule 2-4 mg  2-4 mg Oral PRN Johnn Hai, MD      . magnesium hydroxide (MILK OF MAGNESIA) suspension 30 mL  30 mL Oral Daily PRN Doren Custard, Rashaun M, NP      . methocarbamol (ROBAXIN) tablet 750 mg  750 mg Oral TID Johnn Hai, MD   750 mg at 09/03/18 0747  . naproxen (NAPROSYN) tablet 500 mg  500 mg Oral BID PRN Johnn Hai, MD      . nicotine polacrilex  (NICORETTE) gum 2 mg  2 mg Oral PRN Sharma Covert, MD      . ondansetron (ZOFRAN-ODT) disintegrating tablet 4 mg  4 mg Oral Q6H PRN Johnn Hai, MD      . prenatal multivitamin tablet 1 tablet  1 tablet Oral Q1200 Johnn Hai, MD   1 tablet at 09/02/18 1257  . temazepam (RESTORIL) capsule 30 mg  30 mg Oral QHS Johnn Hai, MD   30 mg at 09/02/18 2109  . traZODone (DESYREL) tablet 50 mg  50 mg Oral QHS PRN Deloria Lair, NP        Lab Results:  Results for orders placed or performed during the hospital encounter of 09/02/18 (from the past 48 hour(s))  CBC     Status: None   Collection Time: 09/03/18  6:32 AM  Result Value Ref Range   WBC 6.7 4.0 - 10.5 K/uL   RBC 4.31 3.87 - 5.11 MIL/uL   Hemoglobin 13.7 12.0 - 15.0 g/dL   HCT 41.8 36.0 - 46.0 %   MCV 97.0 80.0 - 100.0 fL   MCH 31.8 26.0 - 34.0 pg   MCHC 32.8 30.0 - 36.0 g/dL   RDW 12.4 11.5 - 15.5 %   Platelets 318 150 - 400 K/uL   nRBC 0.0 0.0 - 0.2 %    Comment: Performed at [facility], [place] [address], [place], [place] [address]    Blood Alcohol level:  Lab Results  Component Value Date   ETH 201 (H) 09/01/2018   ETH <11 12/45/8099    Metabolic Disorder Labs: No results found for: HGBA1C, MPG No results found for: PROLACTIN No results found for: CHOL, TRIG, HDL, CHOLHDL, VLDL, LDLCALC  Physical Findings: AIMS: Facial and Oral Movements Muscles of Facial Expression: None, normal Lips and Perioral Area: None, normal Jaw: None, normal Tongue: None, normal,Extremity Movements Upper (arms, wrists, hands, fingers): None, normal Lower (legs, knees, ankles, toes): None, normal, Trunk Movements Neck, shoulders, hips: None, normal, Overall Severity Severity of abnormal movements (highest score from questions above): None, normal Incapacitation due to abnormal movements: None, normal Patient's awareness of abnormal movements (rate only patient's report): No  Awareness, Dental Status Current  problems with teeth and/or dentures?: No Does patient usually wear dentures?: No  CIWA:  CIWA-Ar Total: 2 COWS:  COWS Total Score: 0  Musculoskeletal: Strength & Muscle Tone: within normal limits Gait & Station: normal Patient leans: N/A  Psychiatric Specialty Exam: Physical Exam  ROS  Blood pressure 114/79, pulse 96, temperature 98.9 F (37.2 C), temperature source Oral, resp. rate 18, height 5\' 4"  (1.626 m), weight 58 kg, last menstrual period 08/07/2018, SpO2 99 %.Body mass index is 21.95 kg/m.  General Appearance: Casual  Eye Contact:  Good  Speech:  Clear and Coherent  Volume:  Normal  Mood:  Anxious and Dysphoric  Affect:  Constricted  Thought Process:  Coherent and Descriptions of Associations: Intact  Orientation:  Full (Time, Place, and Person)  Thought Content:  Rumination  Suicidal Thoughts:  No  Homicidal Thoughts:  No  Memory:  Immediate;   Good Recent;   Good Remote;   Good  Judgement:  Good  Insight:  Good  Psychomotor Activity:  Normal  Concentration:  Concentration: Good and Attention Span: Good  Recall:  Good  Fund of Knowledge:  Good  Language:  Good  Akathisia:  Negative  Handed:  Right  AIMS (if indicated):     Assets:  Physical Health Resilience  ADL's:  Intact  Cognition:  WNL  Sleep:  Number of Hours: 6.75     Treatment Plan Summary: Daily contact with patient to assess and evaluate symptoms and progress in treatment and Medication management  Continue current detox measures Taper Librium continue cognitive therapy continue antidepressant therapy as well as rehab based work.  Probable discharge within 24 to 48 hours  Latitia Housewright, MD 09/03/2018, 8:22 AM

## 2018-09-03 NOTE — Care Management (Signed)
CMA spoke to Legrand Como with Admissions at [facility]. Per Legrand Como, patient needs Medicare or commercial insurance.   CMA will send referral to [facility] and continue to follow up with [facility].    CMA will notify CSW.     Jeanne Diefendorf Care Management Assistant  Email:[email or web address] Office: [phone]

## 2018-09-03 NOTE — Plan of Care (Signed)
D: Denies SI, HI, AVH, and verbally contracts for safety.    A: Medications administered per MD order. Support provided. Patient educated on safety on the unit and medications. Routine safety checks every 15 minutes. Patient stated understanding to tell nurse about any new physical symptoms. Patient understands to tell staff of any needs.     R: No adverse drug reactions noted. Patient verbally contracts for safety. Patient remains safe at this time and will continue to monitor.   Problem: Safety: Goal: Periods of time without injury will increase Outcome: Progressing   [place] NOVEL CORONAVIRUS (COVID-19) DAILY CHECK-OFF SYMPTOMS - answer yes or no to each - every day NO YES  Have you had a fever in the past 24 hours?  Fever (Temp > 37.80C / 100F) X   Have you had any of these symptoms in the past 24 hours? New Cough  Sore Throat   Shortness of Breath  Difficulty Breathing  Unexplained Body Aches   X   Have you had any one of these symptoms in the past 24 hours not related to allergies?   Runny Nose  Nasal Congestion  Sneezing   X   If you have had runny nose, nasal congestion, sneezing in the past 24 hours, has it worsened?  X   EXPOSURES - check yes or no X   Have you traveled outside the state in the past 14 days?  X   Have you been in contact with someone with a confirmed diagnosis of COVID-19 or PUI in the past 14 days without wearing appropriate PPE?  X   Have you been living in the same home as a person with confirmed diagnosis of COVID-19 or a PUI (household contact)?    X   Have you been diagnosed with COVID-19?    X              What to do next: Answered NO to all: Answered YES to anything:   Proceed with unit schedule Follow the [facility] Inpatient Flowsheet.    

## 2018-09-03 NOTE — Care Management (Signed)
CMA sent referral to [facility] and [facility].   CMA will follow up with both facilities.    CMA will notify CSW.      Cyd Hostler Care Management Assistant  Email:[email or web address] Office: [phone]

## 2018-09-03 NOTE — Care Management (Signed)
CMA sent referral to [facility].   CMA will follow up.    CMA will notify CSW, Stephanie Acre.      Vinaya Sancho Care Management Assistant  Email:[email or web address] Office: [phone]
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
## 2018-09-04 NOTE — Progress Notes (Signed)
[facility]  09/04/2018 8:05 AM April SpillersLaura A [name]  MRN:  [id number] Subjective:   Patient reports that her withdrawal symptoms are generally under control she is alert oriented and cooperative without any thoughts of harming self or others today can contract here.  Again she has no cravings today no tremors and no withdrawal symptoms.  Engaged in cognitive and rehab therapy individually and in groups.  No change in medications requested.  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 Anxiety   . Bronchitis   . Depression     Past Surgical History:  Procedure Laterality Date  . ANKLE SURGERY     left   Family History:  Family History  Problem Relation Age of Onset  . Hypertension Mother   . Diabetes Other   . Cancer Other   . Hypertension Other    Family Psychiatric  History: nonew Social History:  Social History   Substance and Sexual Activity  Alcohol Use Yes   Comment: daily     Social History   Substance and Sexual Activity  Drug Use Yes  . Types: Cocaine    Social History   Socioeconomic History  . Marital status: Single    Spouse name: Not on file  . Number of children: Not on file  . Years of education: Not on file  . Highest education level: Not on file  Occupational History  . Not on file  Social Needs  . Financial resource strain: Not on file  . Food insecurity    Worry: Not on file    Inability: Not on file  . Transportation needs    Medical: Not on file    Non-medical: Not on file  Tobacco Use  . Smoking status: Current Every  Day Smoker    Packs/day: 0.05    Years: 12.00    Pack years: 0.60    Types: Cigarettes  . Smokeless tobacco: Never Used  Substance and Sexual Activity  . Alcohol use: Yes    Comment: daily  . Drug use: Yes    Types: Cocaine  . Sexual activity: Not Currently  Lifestyle  . Physical activity    Days per week: Not on file    Minutes per session: Not on file  . Stress: Not on file  Relationships  . Social Musicianconnections    Talks on phone: Not on file    Gets together: Not on file    Attends religious service: Not on file    Active member of club or organization: Not on file    Attends meetings of clubs or organizations: Not on file    Relationship status: Not on file  Other Topics Concern  . Not on file  Social History Narrative  . Not on file   Additional Social History:                         Sleep: Good  Appetite:  Good  Current Medications: Current Facility-Administered Medications  Medication Dose Route Frequency Provider Last Rate Last Dose  . acetaminophen (TYLENOL) tablet 650 mg  650 mg Oral Q6H PRN Jearld Leschixon, Rashaun M, NP  650 mg at 09/02/18 1831  . alum & mag hydroxide-simeth (MAALOX/MYLANTA) 200-200-20 MG/5ML suspension 30 mL  30 mL Oral Q4H PRN Dixon, Rashaun M, NP      . chlordiazePOXIDE (LIBRIUM) capsule 10 mg  10 mg Oral TID Johnn Hai, MD   10 mg at 09/04/18 0751  . dicyclomine (BENTYL) tablet 20 mg  20 mg Oral Q6H PRN Johnn Hai, MD      . FLUoxetine (PROZAC) capsule 20 mg  20 mg Oral Daily Johnn Hai, MD   20 mg at 09/04/18 0752  . gabapentin (NEURONTIN) capsule 300 mg  300 mg Oral TID Johnn Hai, MD   300 mg at 09/04/18 0752  . hydrOXYzine (ATARAX/VISTARIL) tablet 25 mg  25 mg Oral Q6H PRN Johnn Hai, MD   25 mg at 09/04/18 0757  . loperamide (IMODIUM) capsule 2-4 mg  2-4 mg Oral PRN Johnn Hai, MD      . magnesium hydroxide (MILK OF MAGNESIA) suspension 30 mL  30 mL Oral Daily PRN Dixon, Rashaun M, NP      . methocarbamol (ROBAXIN)  tablet 750 mg  750 mg Oral TID Johnn Hai, MD   750 mg at 09/04/18 7209  . naproxen (NAPROSYN) tablet 500 mg  500 mg Oral BID PRN Johnn Hai, MD      . nicotine polacrilex (NICORETTE) gum 2 mg  2 mg Oral PRN Sharma Covert, MD   2 mg at 09/04/18 0752  . ondansetron (ZOFRAN-ODT) disintegrating tablet 4 mg  4 mg Oral Q6H PRN Johnn Hai, MD      . prenatal multivitamin tablet 1 tablet  1 tablet Oral Q1200 Johnn Hai, MD   1 tablet at 09/03/18 1218  . temazepam (RESTORIL) capsule 30 mg  30 mg Oral QHS Johnn Hai, MD   30 mg at 09/03/18 2105  . traZODone (DESYREL) tablet 50 mg  50 mg Oral QHS PRN Deloria Lair, NP        Lab Results:  Results for orders placed or performed during the hospital encounter of 09/02/18 (from the past 48 hour(s))  CBC     Status: None   Collection Time: 09/03/18  6:32 AM  Result Value Ref Range   WBC 6.7 4.0 - 10.5 K/uL   RBC 4.31 3.87 - 5.11 MIL/uL   Hemoglobin 13.7 12.0 - 15.0 g/dL   HCT 41.8 36.0 - 46.0 %   MCV 97.0 80.0 - 100.0 fL   MCH 31.8 26.0 - 34.0 pg   MCHC 32.8 30.0 - 36.0 g/dL   RDW 12.4 11.5 - 15.5 %   Platelets 318 150 - 400 K/uL   nRBC 0.0 0.0 - 0.2 %    Comment: Performed at [facility], [place] [address], [place], [place] [address]  Hemoglobin A1c     Status: None   Collection Time: 09/03/18  6:32 AM  Result Value Ref Range   Hgb A1c MFr Bld 5.1 4.8 - 5.6 %    Comment: (NOTE) Pre diabetes:          5.7%-6.4% Diabetes:              >6.4% Glycemic control for   <7.0% adults with diabetes    Mean Plasma Glucose 99.67 mg/dL    Comment: Performed at [facility] [address] [place] [address]    Blood Alcohol level:  Lab Results  Component Value Date   ETH 201 (H) 09/01/2018   ETH <11 01/20/2011  Metabolic Disorder Labs: Lab Results  Component Value Date   HGBA1C 5.1 09/03/2018   MPG 99.67 09/03/2018   No results found for: PROLACTIN No results found for: CHOL, TRIG, HDL,  CHOLHDL, VLDL, LDLCALC  Physical Findings: AIMS: Facial and Oral Movements Muscles of Facial Expression: None, normal Lips and Perioral Area: None, normal Jaw: None, normal Tongue: None, normal,Extremity Movements Upper (arms, wrists, hands, fingers): None, normal Lower (legs, knees, ankles, toes): None, normal, Trunk Movements Neck, shoulders, hips: None, normal, Overall Severity Severity of abnormal movements (highest score from questions above): None, normal Incapacitation due to abnormal movements: None, normal Patient's awareness of abnormal movements (rate only patient's report): No Awareness, Dental Status Current problems with teeth and/or dentures?: No Does patient usually wear dentures?: No  CIWA:  CIWA-Ar Total: 2 COWS:  COWS Total Score: 0  Musculoskeletal: Strength & Muscle Tone: within normal limits Gait & Station: normal Patient leans: N/A  Psychiatric Specialty Exam: Physical Exam  ROS  Blood pressure 125/80, pulse (!) 105, temperature 99.5 F (37.5 C), temperature source Oral, resp. rate 18, height 5\' 4"  (1.626 m), weight 58 kg, last menstrual period 08/07/2018, SpO2 99 %.Body mass index is 21.95 kg/m.  General Appearance: Casual  Eye Contact:  Good  Speech:  Clear and Coherent  Volume:  Normal  Mood:  Dysphoric  Affect:  Appropriate  Thought Process:  Coherent with intact associations  Orientation:  Full (Time, Place, and Person)  Thought Content:  Logical and Rumination  Suicidal Thoughts:  No  Homicidal Thoughts:  No  Memory:  Recent;   Good  Judgement:  Good  Insight:  Good  Psychomotor Activity:  Normal  Concentration:  Concentration: Good and Attention Span: Good  Recall:  Good  Fund of Knowledge:  Good  Language:  Good  Akathisia:  Negative  Handed:  Right  AIMS (if indicated):     Assets:  Physical Health Resilience  ADL's:  Intact  Cognition:  WNL  Sleep:  Number of Hours: 6.75     Treatment Plan Summary: Daily contact with  patient to assess and evaluate symptoms and progress in treatment and Medication management continue current cognitive and rehab therapies continue detox measures and antidepressant therapy monitor 1 more day probable discharge tomorrow no change in precautions  Shaili Donalson, MD 09/04/2018, 8:05 AM

## 2018-09-04 NOTE — Progress Notes (Signed)
D:  Patient denied SI and HI, contracts for safety.  Denied A/V hallucinations.  Denied pain. A:  Medications administered per MD orders.  Emotional support and encouragement given patient. R:  Safety maintained with 15 minute checks. Patient stayed in bed most of the day resting.

## 2018-09-04 NOTE — Tx Team (Addendum)
Interdisciplinary Treatment and Diagnostic Plan Update  09/04/2018 Time of Session: 9:00 am April [name] MRN: [id number]  Principal Diagnosis: <principal problem not specified>  Secondary Diagnoses: Active Problems:   MDD (major depressive disorder)   Current Medications:  Current Facility-Administered Medications  Medication Dose Route Frequency Provider Last Rate Last Dose  . acetaminophen (TYLENOL) tablet 650 mg  650 mg Oral Q6H PRN Deloria Lair, NP   650 mg at 09/02/18 1831  . alum & mag hydroxide-simeth (MAALOX/MYLANTA) 200-200-20 MG/5ML suspension 30 mL  30 mL Oral Q4H PRN Dixon, Rashaun M, NP      . chlordiazePOXIDE (LIBRIUM) capsule 10 mg  10 mg Oral TID Johnn Hai, MD   10 mg at 09/04/18 0751  . dicyclomine (BENTYL) tablet 20 mg  20 mg Oral Q6H PRN Johnn Hai, MD      . FLUoxetine (PROZAC) capsule 20 mg  20 mg Oral Daily Johnn Hai, MD   20 mg at 09/04/18 0752  . gabapentin (NEURONTIN) capsule 300 mg  300 mg Oral TID Johnn Hai, MD   300 mg at 09/04/18 0752  . hydrOXYzine (ATARAX/VISTARIL) tablet 25 mg  25 mg Oral Q6H PRN Johnn Hai, MD   25 mg at 09/04/18 0757  . loperamide (IMODIUM) capsule 2-4 mg  2-4 mg Oral PRN Johnn Hai, MD      . magnesium hydroxide (MILK OF MAGNESIA) suspension 30 mL  30 mL Oral Daily PRN Dixon, Rashaun M, NP      . methocarbamol (ROBAXIN) tablet 750 mg  750 mg Oral TID Johnn Hai, MD   750 mg at 09/04/18 0354  . naproxen (NAPROSYN) tablet 500 mg  500 mg Oral BID PRN Johnn Hai, MD      . nicotine polacrilex (NICORETTE) gum 2 mg  2 mg Oral PRN Sharma Covert, MD   2 mg at 09/04/18 0752  . ondansetron (ZOFRAN-ODT) disintegrating tablet 4 mg  4 mg Oral Q6H PRN Johnn Hai, MD      . prenatal multivitamin tablet 1 tablet  1 tablet Oral Q1200 Johnn Hai, MD   1 tablet at 09/03/18 1218  . temazepam (RESTORIL) capsule 30 mg  30 mg Oral QHS Johnn Hai, MD   30 mg at 09/03/18 2105  . traZODone (DESYREL) tablet 50 mg  50 mg Oral  QHS PRN Deloria Lair, NP       PTA Medications: Medications Prior to Admission  Medication Sig Dispense Refill Last Dose  . albuterol (PROVENTIL HFA;VENTOLIN HFA) 108 (90 BASE) MCG/ACT inhaler Inhale 2 puffs into the lungs every 6 (six) hours as needed. For bronchitis      . diphenhydrAMINE (BENADRYL) 25 mg capsule Take 25 mg by mouth daily as needed for sleep.       Patient Stressors: Medication change or noncompliance Substance abuse  Patient Strengths: Average or above average intelligence Capable of independent living Motivation for treatment/growth Physical Health Supportive family/friends  Treatment Modalities: Medication Management, Group therapy, Case management,  1 to 1 session with clinician, Psychoeducation, Recreational therapy.   Physician Treatment Plan for Primary Diagnosis: <principal problem not specified> Long Term Goal(s): Improvement in symptoms so as ready for discharge Improvement in symptoms so as ready for discharge   Short Term Goals: Ability to demonstrate self-control will improve Ability to identify and develop effective coping behaviors will improve Ability to maintain clinical measurements within normal limits will improve Compliance with prescribed medications will improve Ability to identify and develop effective coping behaviors will improve  Ability to maintain clinical measurements within normal limits will improve Compliance with prescribed medications will improve  Medication Management: Evaluate patient's response, side effects, and tolerance of medication regimen.  Therapeutic Interventions: 1 to 1 sessions, Unit Group sessions and Medication administration.  Evaluation of Outcomes: Not Met  Physician Treatment Plan for Secondary Diagnosis: Active Problems:   MDD (major depressive disorder)  Long Term Goal(s): Improvement in symptoms so as ready for discharge Improvement in symptoms so as ready for discharge   Short Term Goals:  Ability to demonstrate self-control will improve Ability to identify and develop effective coping behaviors will improve Ability to maintain clinical measurements within normal limits will improve Compliance with prescribed medications will improve Ability to identify and develop effective coping behaviors will improve Ability to maintain clinical measurements within normal limits will improve Compliance with prescribed medications will improve     Medication Management: Evaluate patient's response, side effects, and tolerance of medication regimen.  Therapeutic Interventions: 1 to 1 sessions, Unit Group sessions and Medication administration.  Evaluation of Outcomes: Not Met   RN Treatment Plan for Primary Diagnosis: <principal problem not specified> Long Term Goal(s): Knowledge of disease and therapeutic regimen to maintain health will improve  Short Term Goals: Ability to demonstrate self-control, Ability to verbalize feelings will improve, Ability to disclose and discuss suicidal ideas, Ability to identify and develop effective coping behaviors will improve and Compliance with prescribed medications will improve  Medication Management: RN will administer medications as ordered by provider, will assess and evaluate patient's response and provide education to patient for prescribed medication. RN will report any adverse and/or side effects to prescribing provider.  Therapeutic Interventions: 1 on 1 counseling sessions, Psychoeducation, Medication administration, Evaluate responses to treatment, Monitor vital signs and CBGs as ordered, Perform/monitor CIWA, COWS, AIMS and Fall Risk screenings as ordered, Perform wound care treatments as ordered.  Evaluation of Outcomes: Not Met   LCSW Treatment Plan for Primary Diagnosis: <principal problem not specified> Long Term Goal(s): Safe transition to appropriate next level of care at discharge, Engage patient in therapeutic group addressing  interpersonal concerns.  Short Term Goals: Engage patient in aftercare planning with referrals and resources, Increase social support, Increase emotional regulation, Identify triggers associated with mental health/substance abuse issues and Increase skills for wellness and recovery  Therapeutic Interventions: Assess for all discharge needs, 1 to 1 time with Social worker, Explore available resources and support systems, Assess for adequacy in community support network, Educate family and significant other(s) on suicide prevention, Complete Psychosocial Assessment, Interpersonal group therapy.  Evaluation of Outcomes: Not Met   Progress in Treatment: Attending groups: No. Participating in groups: No. Taking medication as prescribed: Yes. Toleration medication: Yes. Family/Significant other contact made: No, will contact:  friend or mother prior to discharge. Patient understands diagnosis: Yes. Discussing patient identified problems/goals with staff: Yes. Medical problems stabilized or resolved: Yes. Denies suicidal/homicidal ideation: Yes. Issues/concerns per patient self-inventory: Yes.  New problem(s) identified: Yes, Describe:  Financial stressors, grief / loss  New Short Term/Long Term Goal(s): detox, medication management, elimination of suicidal thoughts and aftercare referral.   Patient Goals:  "Hoping  I can be happy with out substances"  Discharge Plan or Barriers: Limited beds for treatment due to COVID. Patient wants to go to treatment.   Reason for Continuation of Hospitalization: Anxiety Depression Suicidal ideation Withdrawal symptoms  Estimated Length of Stay: 3-5 days  Attendees: Patient: April [name] 09/04/2018 11:37 AM  Physician: Dr. Jake Samples, MD 09/04/2018 11:37 AM  Nursing:  Rise Paganini, RN 09/04/2018 11:37 AM  RN Care Manager: 09/04/2018 11:37 AM  Social Worker: Rise Mu, LCSW / Chi Garlow Acre, LCSW 09/04/2018 11:37 AM  Recreational Therapist:  09/04/2018 11:37  AM  Other:  09/04/2018 11:37 AM  Other:  09/04/2018 11:37 AM  Other: 09/04/2018 11:37 AM    Scribe for Treatment Team: Tye Savoy, LCSW 09/04/2018 11:37 AM

## 2018-09-04 NOTE — Care Management (Signed)
CMA spoke with Intake Coordinator, [place] at [facility]. Per Shayla, beds are limited at this time due to COVID. However, Myrlene Broker will follow up with patient after discharge when a possible bed becomes available.   Patient's referral is under review with [facility].     Annah Jasko Care Management Assistant  Email:[email or web address] Office: [phone]

## 2018-09-04 NOTE — Progress Notes (Signed)
Pt observe in room, just woke up. Pt denies SI/HI/AVH on approach. Pt was assertive with interaction; wanted her HS medications. Pt rates pain 8/10; Headache. Support offered and safety maintained.

## 2018-09-04 NOTE — Plan of Care (Signed)
Nurse discussed anxiety, depression and coping skills with patient.  

## 2018-09-05 DIAGNOSIS — F332 Major depressive disorder, recurrent severe without psychotic features: Principal | ICD-10-CM

## 2018-09-05 MED ORDER — ATOMOXETINE HCL 10 MG PO CAPS
10.0000 mg | ORAL_CAPSULE | Freq: Every day | ORAL | Status: DC
  Administered 2018-09-05 – 2018-09-06 (×2): 10 mg via ORAL
  Filled 2018-09-05: qty 7
  Filled 2018-09-05 (×4): qty 1

## 2018-09-05 NOTE — Progress Notes (Addendum)
[facility]  09/05/2018 10:47 AM April [name]  MRN:  [id number] Subjective:   April [name] reported " feeling a lot better than on admission"  Evaluation: April [name] observed sitting in day room interacting with peers.  She denies suicidal or homicidal ideations.  Denies auditory visual hallucinations.  States her goal for today is to focus on her sobriety.  She reports she has been using " everything" to cope with her depression.  Reports mild cravings.  Rates her depression 4 out of 10 with 10 being the worst.  States she knows substance abuse is unhealthy however having a difficult time with quitting.  Reports a good appetite.  States she is resting well throughout the night.  Patient is requesting medication for ADHD as she reports taking Adderall in the past education was provided with stimulants and substance abuse use.Case staffed with attending psychiatrist will initiate Strattera 10 mg p.o. daily.  patient to discharge on 09/06/2018.  We will continue to monitor for safety.  Support encouragement reassurance was provided.   Principal Problem: Opiate dependence/alcohol abuse and intoxication/cocaine abuse/depression recurrent severe  Diagnosis: Active Problems:   MDD (major depressive disorder)  Total Time spent with patient: 20 minutes  Past Psychiatric History: Recurrent severe depression without psychosis/history of chronic chemical dependency struggle  Past Medical History:  Past Medical History:  Diagnosis Date  . Anxiety   . Bronchitis   . Depression     Past Surgical History:  Procedure Laterality Date  . ANKLE SURGERY     left   Family History:  Family History  Problem Relation Age of Onset  . Hypertension Mother   . Diabetes Other   . Cancer Other   . Hypertension Other    Family Psychiatric  History: nonew Social History:  Social History   Substance and Sexual Activity  Alcohol Use Yes   Comment: daily     Social History   Substance and Sexual Activity   Drug Use Yes  . Types: Cocaine    Social History   Socioeconomic History  . Marital status: Single    Spouse name: Not on file  . Number of children: Not on file  . Years of education: Not on file  . Highest education level: Not on file  Occupational History  . Not on file  Social Needs  . Financial resource strain: Not on file  . Food insecurity    Worry: Not on file    Inability: Not on file  . Transportation needs    Medical: Not on file    Non-medical: Not on file  Tobacco Use  . Smoking status: Current Every Day Smoker    Packs/day: 0.05    Years: 12.00    Pack years: 0.60    Types: Cigarettes  . Smokeless tobacco: Never Used  Substance and Sexual Activity  . Alcohol use: Yes    Comment: daily  . Drug use: Yes    Types: Cocaine  . Sexual activity: Not Currently  Lifestyle  . Physical activity    Days per week: Not on file    Minutes per session: Not on file  . Stress: Not on file  Relationships  . Social Musicianconnections    Talks on phone: Not on file    Gets together: Not on file    Attends religious service: Not on file    Active member of club or organization: Not on file    Attends meetings of clubs or organizations: Not on file  Relationship status: Not on file  Other Topics Concern  . Not on file  Social History Narrative  . Not on file   Additional Social History:                         Sleep: Good  Appetite:  Good  Current Medications: Current Facility-Administered Medications  Medication Dose Route Frequency Provider Last Rate Last Dose  . acetaminophen (TYLENOL) tablet 650 mg  650 mg Oral Q6H PRN Jearld Leschixon, Rashaun M, NP   650 mg at 09/02/18 1831  . alum & mag hydroxide-simeth (MAALOX/MYLANTA) 200-200-20 MG/5ML suspension 30 mL  30 mL Oral Q4H PRN Dixon, Rashaun M, NP      . dicyclomine (BENTYL) tablet 20 mg  20 mg Oral Q6H PRN Malvin JohnsFarah, Brian, MD      . FLUoxetine (PROZAC) capsule 20 mg  20 mg Oral Daily Malvin JohnsFarah, Brian, MD   20 mg at  09/05/18 0744  . gabapentin (NEURONTIN) capsule 300 mg  300 mg Oral TID Malvin JohnsFarah, Brian, MD   300 mg at 09/05/18 0744  . hydrOXYzine (ATARAX/VISTARIL) tablet 25 mg  25 mg Oral Q6H PRN Malvin JohnsFarah, Brian, MD   25 mg at 09/05/18 0746  . loperamide (IMODIUM) capsule 2-4 mg  2-4 mg Oral PRN Malvin JohnsFarah, Brian, MD      . magnesium hydroxide (MILK OF MAGNESIA) suspension 30 mL  30 mL Oral Daily PRN Dixon, Rashaun M, NP      . methocarbamol (ROBAXIN) tablet 750 mg  750 mg Oral TID Malvin JohnsFarah, Brian, MD   750 mg at 09/05/18 0744  . naproxen (NAPROSYN) tablet 500 mg  500 mg Oral BID PRN Malvin JohnsFarah, Brian, MD   500 mg at 09/04/18 2106  . nicotine polacrilex (NICORETTE) gum 2 mg  2 mg Oral PRN Antonieta Pertlary, Greg Lawson, MD   2 mg at 09/05/18 1042  . ondansetron (ZOFRAN-ODT) disintegrating tablet 4 mg  4 mg Oral Q6H PRN Malvin JohnsFarah, Brian, MD      . prenatal multivitamin tablet 1 tablet  1 tablet Oral Q1200 Malvin JohnsFarah, Brian, MD   1 tablet at 09/04/18 1202  . temazepam (RESTORIL) capsule 30 mg  30 mg Oral QHS Malvin JohnsFarah, Brian, MD   30 mg at 09/04/18 2106  . traZODone (DESYREL) tablet 50 mg  50 mg Oral QHS PRN Jearld Leschixon, Rashaun M, NP        Lab Results:  No results found for this or any previous visit (from the past 48 hour(s)).  Blood Alcohol level:  Lab Results  Component Value Date   ETH 201 (H) 09/01/2018   ETH <11 01/20/2011    Metabolic Disorder Labs: Lab Results  Component Value Date   HGBA1C 5.1 09/03/2018   MPG 99.67 09/03/2018   No results found for: PROLACTIN No results found for: CHOL, TRIG, HDL, CHOLHDL, VLDL, LDLCALC  Physical Findings: AIMS: Facial and Oral Movements Muscles of Facial Expression: None, normal Lips and Perioral Area: None, normal Jaw: None, normal Tongue: None, normal,Extremity Movements Upper (arms, wrists, hands, fingers): None, normal Lower (legs, knees, ankles, toes): None, normal, Trunk Movements Neck, shoulders, hips: None, normal, Overall Severity Severity of abnormal movements (highest score from  questions above): None, normal Incapacitation due to abnormal movements: None, normal Patient's awareness of abnormal movements (rate only patient's report): No Awareness, Dental Status Current problems with teeth and/or dentures?: No Does patient usually wear dentures?: No  CIWA:  CIWA-Ar Total: 1 COWS:  COWS Total Score: 0  Musculoskeletal:  Strength & Muscle Tone: within normal limits Gait & Station: normal Patient leans: N/A  Psychiatric Specialty Exam: Physical Exam  Nursing note and vitals reviewed. Constitutional: She appears well-developed.  Psychiatric: She has a normal mood and affect. Her behavior is normal.    Review of Systems  Psychiatric/Behavioral: Positive for depression and substance abuse. The patient is nervous/anxious.   All other systems reviewed and are negative.   Blood pressure 117/86, pulse 86, temperature 98.8 F (37.1 C), resp. rate 20, height 5\' 4"  (1.626 m), weight 58 kg, last menstrual period 08/07/2018, SpO2 100 %.Body mass index is 21.95 kg/m.  General Appearance: Casual  Eye Contact:  Good  Speech:  Clear and Coherent  Volume:  Normal  Mood:  Anxious and Dysphoric  Affect:  Appropriate  Thought Process:  Coherent   Orientation:  Full (Time, Place, and Person)  Thought Content:  Logical  Suicidal Thoughts:  No  Homicidal Thoughts:  No  Memory:  Recent;   Good  Judgement:  Good  Insight:  Good  Psychomotor Activity:  Normal  Concentration:  Concentration: Good and Attention Span: Good  Recall:  Good  Fund of Knowledge:  Good  Language:  Good  Akathisia:  Negative  Handed:  Right  AIMS (if indicated):     Assets:  Physical Health Resilience  ADL's:  Intact  Cognition:  WNL  Sleep:  Number of Hours: 6.75     Treatment Plan Summary: Daily contact with patient to assess and evaluate symptoms and progress in treatment and Medication management   Continue with current treatment plan on 09/05/2018 as listed below except were  noted  Mood stabilization:  Initiated Strattera 10 mg p.o. daily Continue Prozac 20 mg p.o. daily Continue Neurontin 300 mg p.o. 3 times daily Continue Restoril 30 mg p.o. nightly  CSW to continue working on discharge disposition Patient encouraged to participate within the therapeutic milieu  Derrill Center, NP 09/05/2018, 10:47 AM

## 2018-09-05 NOTE — Plan of Care (Signed)
  Problem: Education: Goal: Knowledge of [facility] Education information/materials will improve Outcome: Progressing Goal: Emotional status will improve Outcome: Progressing Goal: Mental status will improve Outcome: Progressing Goal: Verbalization of understanding the information provided will improve Outcome: Progressing   
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
## 2018-09-05 NOTE — Progress Notes (Signed)
Adult Psychoeducational Group Note  Date:  09/05/2018 Time:  11:13 PM  Group Topic/Focus:  Wrap-Up Group:   The focus of this group is to help patients review their daily goal of treatment and discuss progress on daily workbooks.  Participation Level:  Active  Participation Quality:  Appropriate  Affect:  Appropriate  Cognitive:  Appropriate  Insight: Appropriate  Engagement in Group:  Engaged  Modes of Intervention:  Discussion  Additional Comments:  Pt stated she slept a lot today and believes it could be her new medication.  Pt stated her goal was to try to be happy and handle situations with the drugs.  Pt stated her goal is ongoing and that she is experiencing withdraws which is very uncomfortable.  Pt rated the day at a 5/10.  Rian Koon 09/05/2018, 11:13 PM

## 2018-09-06 MED ORDER — HYDROXYZINE HCL 25 MG PO TABS: 25.0000 mg | ORAL_TABLET | Freq: Four times a day (QID) | ORAL | 0 refills | Status: DC | PRN

## 2018-09-06 MED ORDER — PRENATAL MULTIVITAMIN CH: 1.0000 | ORAL_TABLET | Freq: Every day | ORAL | 0 refills | Status: DC

## 2018-09-06 MED ORDER — TRAZODONE HCL 50 MG PO TABS: 50.0000 mg | ORAL_TABLET | Freq: Every evening | ORAL | 0 refills | Status: DC | PRN

## 2018-09-06 MED ORDER — ATOMOXETINE HCL 10 MG PO CAPS: 10.0000 mg | ORAL_CAPSULE | Freq: Every day | ORAL | 0 refills | Status: DC

## 2018-09-06 MED ORDER — GABAPENTIN 300 MG PO CAPS: 300.0000 mg | ORAL_CAPSULE | Freq: Three times a day (TID) | ORAL | 0 refills | Status: DC

## 2018-09-06 MED ORDER — NICOTINE POLACRILEX 2 MG MT GUM: 2.0000 mg | CHEWING_GUM | OROMUCOSAL | 0 refills | Status: DC | PRN

## 2018-09-06 NOTE — Progress Notes (Signed)
D:  Patient's self inventory sheet, patient has fair sleep, sleep medication helpful.  God appetite, low energy level, poor concentration.  Rated depression 5, hopeless 6, anxiiety 2.  Withdrawals, chilling, cravings, cramping, agitation, irritability.  Denied SI.  Physical problems, headaches, worst pain in past 24 hours is #7, aches all over her body.  Goal is discharge.  Plans to talk to others.  No discharge plans. A:  Medications administered per  MD orders.  Emotional support and encouragement given patient. R: Patient denied SI and HI, contracts for safety.  Denied A/V hallucinations.   Safety maintained with 15 minute checks.

## 2018-09-06 NOTE — Progress Notes (Signed)
  [facility] Adult Case Management Discharge Plan :  Will you be returning to the same living situation after discharge:  No.  Going to stay with friend April [name] At discharge, do you have transportation home?: Yes,  April Grams Do you have the ability to pay for your medications: No.  Will need assistance from community agency  Release of information consent forms completed and turned in to Medical Records by CSW.   Patient to Follow up at: Follow-up [name] up.   Specialty: Addiction Medicine Why: A referral has been made. At this time beds are limited due to [place]. The Intake Coordinator, April [name] will contact you after discharge regarding admission. If you do not hear back from her within 3 days of discharge please follow up with her.  Contact information: [place] [address] [place], April [name] Follow up.   Why: A referral has been made. Your referral is currently under review. If you wish to seek treatment still please follow up with Admissions and speak with April [name] or April [name].  Contact information: [place] [address] [phone]        Services, [facility] Follow up.   Why: Hospital staff will make a hospital discharge appointment for you and call at your friend April [name]'s number [phone] with the information on Monday. Contact information: [address]  [place] [address] [phone]           Next level of care provider has access to [place] and Suicide Prevention discussed: No.  Attempts made with 2 friends  Have you used any form of tobacco in the last 30 days? (Cigarettes, Smokeless Tobacco, Cigars, and/or Pipes): Yes  Has patient been referred to the Quitline?: Patient refused referral  Patient has been referred for addiction treatment: Yes  Maretta Los, LCSW 09/06/2018, 10:00  AM

## 2018-09-06 NOTE — BHH Suicide Risk Assessment (Signed)
[place] INPATIENT:  Family/Significant Other Suicide Prevention Education  Suicide Prevention Education:  Contact Attempts: Friends Carita Pian [phone] and Mickle Plumb [phone] have been identified by the patient as the family member/significant other with whom the patient will be residing, and identified as the person(s) who will aid the patient in the event of a mental health crisis.  With written consent from the patient, two attempts were made to provide suicide prevention education, prior to and/or following the patient's discharge.  We were unsuccessful in providing suicide prevention education.  A suicide education pamphlet was given to the patient to share with family/significant other.  Date and time of first attempt:  09/06/2018  /  9:30am with Rodman Key Date and time of second attempt:  09/06/2018  /  10:00 AM with Esaw Dace 09/06/2018, 9:59 AM

## 2018-09-06 NOTE — Progress Notes (Signed)
Writer spoke with patient and she reports that she is supposed to discharge on tomorrow and she was hoping to go to a treatment facility. She reports that she has two friends that are very supportive of her and she will be calling on them. She was encouraged to speak with her social worker concerning treatment facilities available. Support given and safety maintained on unit with 15 min checks.

## 2018-09-06 NOTE — BHH Group Notes (Signed)
[place] Group Notes:  (Nursing/MHT/Case Management/Adjunct)  Date:  09/06/2018  Time:  1330  Type of Therapy:  Nurse Education  Participation Level:  Minimal  Participation Quality:  Appropriate  Affect:  Appropriate  Cognitive:  Appropriate  Insight:  Appropriate  Engagement in Group:  Limited  Modes of Intervention:  Activity  Summary of Progress/Problems: Minimal interaction due to the pt waiting to discharge home.  Heliodoro Domagalski L 09/06/2018,

## 2018-09-06 NOTE — Discharge Summary (Signed)
Physician Discharge Summary Note  Patient:  April April [name] April April [name] is an 38 y.o., female MRN:  [id number] DOB:  01/20/1981 Patient phone:  [phone] (home)  Patient address:   [address] [place] [address],  Total Time spent with patient: 15 minutes  Date of Admission:  09/02/2018 Date of Discharge: 09/06/2018  Reason for Admission:  Per assessment note: This is the first admission here however the second chemical dependency/depression related admission overall for April April [name], she is 38 years of age and again has not been admitted in 5 years.Prior admission was at [facility] she states at that point in time she needed detox but was not admitting to it and was in denial and sought treatment and instead for just depression.  She presented here after found lying on the side of the road EMS arrived found her intoxicated she stated she also took some Benadryl and acknowledged suicidal thoughts, recurrent depression so forth.   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  She reports April [name] history of chronic depression with past treatment with Prozac, Paxil, Zoloft, and many others to include venlafaxine which she felt made her feel like April [name] zombie.  She reports suicidal thoughts that come and go without current plans or intent.  She also reports she has been living with her boyfriend who also abuses opiates and she cannot go back there but she will be staying with April [name] friend.  She has not worked since February and she used to work at April [name] [employer] and really misses it because it kept her busy and of course that limited her substance use.  She states she has been also abusing cocaine and cannabis.  Other symptoms include depression, mood swings where she will cry 1 minute and the angry the next, feeling like her skin is crawling, anhedonia, insomnia, and poor frustration tolerance.  She  denies psychotic symptoms or seizures.Blood alcohol level 201, drug screen positive for cocaine  Principal Problem: MDD (major depressive disorder) Discharge Diagnoses: Principal Problem:   MDD (major depressive disorder)   Past Psychiatric History:   Past Medical History:  Past Medical History:  Diagnosis Date  . Anxiety   . Bronchitis   . Depression     Past Surgical History:  Procedure Laterality Date  . ANKLE SURGERY     left   Family History:  Family History  Problem Relation Age of Onset  . Hypertension Mother   . Diabetes Other   . Cancer Other   . Hypertension Other    Family Psychiatric  History:  Social History:  Social History   Substance and Sexual Activity  Alcohol Use Yes   Comment: daily     Social History   Substance and Sexual Activity  Drug Use Yes  . Types: Cocaine    Social History   Socioeconomic History  . Marital status: Single    Spouse name: Not on file  . Number of children: Not on file  . Years of education: Not on file  . Highest education level: Not on file  Occupational History  . Not on file  Social Needs  . Financial resource strain: Not on file  . Food insecurity    Worry: Not on file    Inability: Not on file  . Transportation needs    Medical: Not on file    Non-medical: Not on file  Tobacco Use  . Smoking status: Current Every Day Smoker    Packs/day: 0.05    Years: 12.00    Pack years: 0.60    Types: Cigarettes  . Smokeless tobacco: Never Used  Substance and Sexual Activity  . Alcohol use: Yes    Comment: daily  . Drug use: Yes    Types: Cocaine  . Sexual activity: Not Currently  Lifestyle  . Physical activity    Days per week: Not on file    Minutes per session: Not on file  . Stress: Not on file  Relationships  . Social Musicianconnections    Talks on phone: Not on file    Gets together: Not on file    Attends religious service: Not on file    Active member of club or organization: Not on file     Attends meetings of clubs or organizations: Not on file    Relationship status: Not on file  Other Topics Concern  . Not on file  Social History Narrative  . Not on file    Hospital Course:  April April April [name] was admitted for MDD (major depressive disorder)and crisis management.  Pt was treated discharged with the medications listed below under Medication List.  Medical problems were identified and treated as needed.  Home medications were restarted as appropriate.  Improvement was monitored by observation and April April April [name] 's daily report of symptom reduction.  Emotional and mental status was monitored by daily self-inventory reports completed by April April April [name] and clinical staff.         April April April [name] was evaluated by the treatment team for stability and plans for continued recovery upon discharge. April April April [name] 's motivation was an integral factor for scheduling further treatment. Employment, transportation, bed availability, health status, family support, and any pending legal issues were also considered during hospital stay. Pt was offered further treatment options upon discharge including but not limited to Residential, Intensive Outpatient, and Outpatient treatment.  April April April [name] will follow up with the services as listed below under Follow Up Information.     Upon completion of this admission the patient was both mentally and medically stable for discharge denying suicidal or homicidal ideation, auditory/visual/tactile hallucinations, delusional thoughts and paranoia.    April April April [name] responded well to treatment with Trazodone 50 mg and Neurontin 300 mg and Strattera 10 mg  without adverse effects. Pt demonstrated improvement without reported or observed adverse effects to the point of stability appropriate for outpatient management. Pertinent labs include:CBC and CMP , for which outpatient follow-up is necessary for lab recheck as mentioned below. Reviewed CBC, CMP, BAL+201, and  UDS-; all unremarkable aside from noted exceptions.   Physical Findings: AIMS: Facial and Oral Movements Muscles of Facial Expression: None, normal Lips and Perioral Area: None, normal Jaw: None, normal Tongue: None, normal,Extremity Movements Upper (arms, wrists, hands, fingers): None, normal Lower (legs, knees, ankles, toes): None, normal, Trunk Movements Neck, shoulders, hips: None, normal, Overall Severity Severity of abnormal movements (highest score from questions above): None, normal Incapacitation due to abnormal movements: None, normal Patient's awareness of abnormal movements (rate only patient's report): No Awareness, Dental Status Current problems with teeth and/or dentures?: No Does patient usually wear dentures?: No  CIWA:  CIWA-Ar Total: 1 COWS:  COWS Total Score: 0  Musculoskeletal: Strength & Muscle Tone: within normal limits Gait & Station: normal Patient leans: N/April [name]  Psychiatric Specialty Exam: Physical Exam  Vitals reviewed. Constitutional: She appears well-developed.  Psychiatric:  She has April [name] normal mood and affect. Her behavior is normal.    Review of Systems  Psychiatric/Behavioral: Positive for depression. Negative for suicidal ideas. The patient is nervous/anxious.   All other systems reviewed and are negative.   Blood pressure (!) 67/45, pulse 99, temperature 99.3 F (37.4 C), temperature source Oral, resp. rate 20, height 5\' 4"  (1.626 m), weight 58 kg, last menstrual period 08/07/2018, SpO2 99 %.Body mass index is 21.95 kg/m.   Have you used any form of tobacco in the last 30 days? (Cigarettes, Smokeless Tobacco, Cigars, and/or Pipes): Yes  Has this patient used any form of tobacco in the last 30 days? (Cigarettes, Smokeless Tobacco, Cigars, and/or Pipes) Yes, Yes, April [name] prescription for an FDA-approved tobacco cessation medication was offered at discharge and the patient refused  Blood Alcohol level:  Lab Results  Component Value Date   ETH 201 (H)  09/01/2018   ETH <11 27/74/1287    Metabolic Disorder Labs:  Lab Results  Component Value Date   HGBA1C 5.1 09/03/2018   MPG 99.67 09/03/2018   No results found for: PROLACTIN No results found for: CHOL, TRIG, HDL, CHOLHDL, VLDL, LDLCALC  See Psychiatric Specialty Exam and Suicide Risk Assessment completed by Attending Physician prior to discharge.  Discharge destination:  Home  Is patient on multiple antipsychotic therapies at discharge:  No   Has Patient had three or more failed trials of antipsychotic monotherapy by history:  No  Recommended Plan for Multiple Antipsychotic Therapies: NA  Discharge Instructions    Diet - low sodium heart healthy   Complete by: As directed    Discharge instructions   Complete by: As directed    Take all medications as prescribed. Keep all follow-up appointments as scheduled.  Do not consume alcohol or use illegal drugs while on prescription medications. Report any adverse effects from your medications to your primary care provider promptly.  In the event of recurrent symptoms or worsening symptoms, call 911, April [name] crisis hotline, or go to the nearest emergency department for evaluation.   Increase activity slowly   Complete by: As directed      Allergies as of 09/06/2018      Reactions   Other Hives   Cashews      Medication List    STOP taking these medications   diphenhydrAMINE 25 mg capsule Commonly known as: BENADRYL     TAKE these medications     Indication  albuterol 108 (90 Base) MCG/ACT inhaler Commonly known as: VENTOLIN HFA Inhale 2 puffs into the lungs every 6 (six) hours as needed. For bronchitis  Indication: Spasm of Lung Air Passages   atomoxetine 10 MG capsule Commonly known as: STRATTERA Take 1 capsule (10 mg total) by mouth daily. Start taking on: September 07, 2018  Indication: Attention Deficit Hyperactivity Disorder   gabapentin 300 MG capsule Commonly known as: NEURONTIN Take 1 capsule (300 mg total) by  mouth 3 (three) times daily.  Indication: Abuse or Misuse of Alcohol   hydrOXYzine 25 MG tablet Commonly known as: ATARAX/VISTARIL Take 1 tablet (25 mg total) by mouth every 6 (six) hours as needed for anxiety.  Indication: Feeling Anxious   nicotine polacrilex 2 MG gum Commonly known as: NICORETTE Take 1 each (2 mg total) by mouth as needed for smoking cessation.  Indication: Nicotine Addiction   prenatal multivitamin Tabs tablet Take 1 tablet by mouth daily at 12 noon.  Indication: Vitamin Deficiency   traZODone 50 MG tablet Commonly known as: DESYREL  Take 1 tablet (50 mg total) by mouth at bedtime as needed for sleep.  Indication: Trouble Sleeping      Follow-up Information    [facility] Follow up.   Specialty: Addiction Medicine Why: April [name] referral has been made. At this time beds are limited due to COVID. The Intake Coordinator, Drema PryShayla will contact you afterr discharge regarding admission. If you do not hear back from her within 3 days of discharge please follow up with her.  Contact information: [address] [place] [address] [phone]        [name] Follow up.   Why: April [name] referral has been made. Your referral is currently under review. If you wish to seek treatment still please follow up with Admissions and speak with Candy or Amy.  Contact information: [address] [place] [address] [phone]           Follow-up recommendations:  Activity:  as tolerated Diet:  heart healthy  Comments:  Take all medications as prescribed. Keep all follow-up appointments as scheduled.  Do not consume alcohol or use illegal drugs while on prescription medications. Report any adverse effects from your medications to your primary care provider promptly.  In the event of recurrent symptoms or worsening symptoms, call 911, April [name] crisis hotline, or go to the nearest emergency department for  evaluation.   Signed: Oneta Rackanika N Lewis, NP 09/06/2018, 9:23 AM

## 2018-09-06 NOTE — Progress Notes (Signed)
Pt discharged to lobby. Pt was stable and appreciative at that time. All papers, samples and prescriptions were given and valuables returned. Verbal understanding expressed. Denies SI/HI and A/VH. Pt given opportunity to express concerns and ask questions.  

## 2018-09-06 NOTE — BHH Suicide Risk Assessment (Signed)
[facility] Discharge Suicide Risk Assessment   Principal Problem: <principal problem not specified> Discharge Diagnoses: Active Problems:   MDD (major depressive disorder)   Total Time spent with patient: 15 minutes  Musculoskeletal: Strength & Muscle Tone: within normal limits Gait & Station: normal Patient leans: N/A  Psychiatric Specialty Exam: Review of Systems  All other systems reviewed and are negative.   Blood pressure (!) 67/45, pulse 99, temperature 99.3 F (37.4 C), temperature source Oral, resp. rate 20, height 5\' 4"  (1.626 m), weight 58 kg, last menstrual period 08/07/2018, SpO2 99 %.Body mass index is 21.95 kg/m.  General Appearance: Casual  Eye Contact::  Fair  Speech:  Normal Rate409  Volume:  Normal  Mood:  Anxious  Affect:  Congruent  Thought Process:  Coherent and Descriptions of Associations: Intact  Orientation:  Full (Time, Place, and Person)  Thought Content:  Logical  Suicidal Thoughts:  No  Homicidal Thoughts:  No  Memory:  Immediate;   Fair Recent;   Fair Remote;   Fair  Judgement:  Intact  Insight:  Fair  Psychomotor Activity:  Increased  Concentration:  Fair  Recall:  [employer] of Knowledge:Fair  Language: Good  Akathisia:  Negative  Handed:  Right  AIMS (if indicated):     Assets:  Desire for Improvement Resilience  Sleep:  Number of Hours: 6.75  Cognition: WNL  ADL's:  Intact   Mental Status Per Nursing Assessment::   On Admission:  Suicidal ideation indicated by patient  Demographic Factors:  Divorced or widowed, Caucasian, Low socioeconomic status and Unemployed  Loss Factors: Decrease in vocational status and Financial problems/change in socioeconomic status  Historical Factors: Impulsivity  Risk Reduction Factors:   Positive coping skills or problem solving skills  Continued Clinical Symptoms:  Depression:   Comorbid alcohol abuse/dependence Impulsivity Alcohol/Substance Abuse/Dependencies  Cognitive Features That  Contribute To Risk:  None    Suicide Risk:  Minimal: No identifiable suicidal ideation.  Patients presenting with no risk factors but with morbid ruminations; may be classified as minimal risk based on the severity of the depressive symptoms  Follow-up [facility] Follow up.   Specialty: Addiction Medicine Why: A referral has been made. At this time beds are limited due to [place]. The Intake Coordinator, Myrlene Broker will contact you afterr discharge regarding admission. If you do not hear back from her within 3 days of discharge please follow up with her.  Contact information: [place] [address] [place], [name] Follow up.   Why: A referral has been made. Your referral is currently under review. If you wish to seek treatment still please follow up with Admissions and speak with Candy or Amy.  Contact information: [place] [address] [phone]           Plan Of Care/Follow-up recommendations:  Activity:  ad lib  Sharma Covert, MD 09/06/2018, 9:16 AM

## 2018-09-06 NOTE — BHH Group Notes (Signed)
[place] LCSW Group Therapy Note  09/06/2018  10:00-11:00AM  Type of Therapy and Topic:  Group Therapy:  Building Supports Despite Barriers  Participation Level:  Active   Description of Group:  Patients in this group were introduced to the idea of adding a variety of healthy supports to address the various needs in their lives, with the the focus on specific barriers to wellness that patients self-identified.  Patients discussed what additional healthy supports could be helpful in their recovery and wellness after discharge in order to prevent future hospitalizations.   An emphasis was placed on following up with the discharge plan including medication management, NA/AA and therapy when they leave the hospital in order to continue becoming healthier and happier.  Therapeutic Goals: 1)   talk about anticipated barriers to ongoing recovery at discharge  2)  identify the patient's current unhealthy supports  3)  identify the patient's current healthy supports and   4)  elicit commitments to add one healthy support to help overcome the above-mentioned barriers to wellness   Summary of Patient Progress:  The patient expressed that her friend Rodman Key is a healthy support while she herself and her drug dealers are unhealthy supports. The patient indicated one healthy support that could be helpful in overcoming barriers to wellness includes becoming a better self-support by staying clean and sober.  Therapeutic Modalities:   Motivational Interviewing Brief Solution-Focused Therapy  Maretta Los

## 2018-12-22 ENCOUNTER — Encounter (HOSPITAL_COMMUNITY): Payer: Self-pay | Admitting: Emergency Medicine

## 2018-12-22 ENCOUNTER — Emergency Department (HOSPITAL_COMMUNITY)
Admission: EM | Admit: 2018-12-22 | Discharge: 2018-12-23 | Disposition: A | Discharge status: Other Acute Inpatient Hospital | Payer: Medicaid Other | Attending: Emergency Medicine | Admitting: Emergency Medicine

## 2018-12-22 ENCOUNTER — Other Ambulatory Visit: Payer: Self-pay

## 2018-12-22 DIAGNOSIS — F17210 Nicotine dependence, cigarettes, uncomplicated: Secondary | ICD-10-CM | POA: Insufficient documentation

## 2018-12-22 DIAGNOSIS — Y904 Blood alcohol level of 80-99 mg/100 ml: Secondary | ICD-10-CM | POA: Insufficient documentation

## 2018-12-22 DIAGNOSIS — Z20828 Contact with and (suspected) exposure to other viral communicable diseases: Secondary | ICD-10-CM | POA: Insufficient documentation

## 2018-12-22 DIAGNOSIS — F1410 Cocaine abuse, uncomplicated: Secondary | ICD-10-CM | POA: Insufficient documentation

## 2018-12-22 DIAGNOSIS — R45851 Suicidal ideations: Secondary | ICD-10-CM | POA: Insufficient documentation

## 2018-12-22 DIAGNOSIS — Z008 Encounter for other general examination: Secondary | ICD-10-CM | POA: Insufficient documentation

## 2018-12-22 DIAGNOSIS — F10920 Alcohol use, unspecified with intoxication, uncomplicated: Secondary | ICD-10-CM

## 2018-12-22 DIAGNOSIS — F32A Depression, unspecified: Secondary | ICD-10-CM

## 2018-12-22 DIAGNOSIS — T50902A Poisoning by unspecified drugs, medicaments and biological substances, intentional self-harm, initial encounter: Secondary | ICD-10-CM | POA: Insufficient documentation

## 2018-12-22 DIAGNOSIS — F321 Major depressive disorder, single episode, moderate: Secondary | ICD-10-CM

## 2018-12-22 DIAGNOSIS — F332 Major depressive disorder, recurrent severe without psychotic features: Secondary | ICD-10-CM | POA: Insufficient documentation

## 2018-12-22 DIAGNOSIS — F10120 Alcohol abuse with intoxication, uncomplicated: Secondary | ICD-10-CM | POA: Insufficient documentation

## 2018-12-22 DIAGNOSIS — Z79899 Other long term (current) drug therapy: Secondary | ICD-10-CM | POA: Insufficient documentation

## 2018-12-22 HISTORY — DX: Complete or unspecified spontaneous abortion without complication: O03.9

## 2018-12-22 LAB — RAPID URINE DRUG SCREEN, HOSP PERFORMED
Amphetamines: NOT DETECTED
Barbiturates: NOT DETECTED
Benzodiazepines: NOT DETECTED
Cocaine: POSITIVE — AB
Opiates: NOT DETECTED
Tetrahydrocannabinol: NOT DETECTED

## 2018-12-22 LAB — COMPREHENSIVE METABOLIC PANEL
ALT: 12 U/L (ref 0–44)
AST: 17 U/L (ref 15–41)
Albumin: 4 g/dL (ref 3.5–5.0)
Alkaline Phosphatase: 71 U/L (ref 38–126)
Anion gap: 10 (ref 5–15)
BUN: 6 mg/dL (ref 6–20)
CO2: 26 mmol/L (ref 22–32)
Calcium: 8.5 mg/dL — ABNORMAL LOW (ref 8.9–10.3)
Chloride: 107 mmol/L (ref 98–111)
Creatinine, Ser: 0.73 mg/dL (ref 0.44–1.00)
GFR calc Af Amer: >60 mL/min (ref >60)
GFR calc non Af Amer: >60 mL/min (ref >60)
Glucose, Bld: 65 mg/dL — ABNORMAL LOW (ref 70–99)
Potassium: 3.4 mmol/L — ABNORMAL LOW (ref 3.5–5.1)
Sodium: 143 mmol/L (ref 135–145)
Total Bilirubin: 0.2 mg/dL — ABNORMAL LOW (ref 0.3–1.2)
Total Protein: 7.2 g/dL (ref 6.5–8.1)

## 2018-12-22 LAB — CBC
HCT: 45.3 % (ref 36.0–46.0)
Hemoglobin: 14.8 g/dL (ref 12.0–15.0)
MCH: 32.2 pg (ref 26.0–34.0)
MCHC: 32.7 g/dL (ref 30.0–36.0)
MCV: 98.5 fL (ref 80.0–100.0)
Platelets: 410 10*3/uL — ABNORMAL HIGH (ref 150–400)
RBC: 4.6 MIL/uL (ref 3.87–5.11)
RDW: 13 % (ref 11.5–15.5)
WBC: 6.7 10*3/uL (ref 4.0–10.5)
nRBC: 0 % (ref 0.0–0.2)

## 2018-12-22 LAB — SALICYLATE LEVEL: Salicylate Lvl: <7 mg/dL (ref 2.8–30.0)

## 2018-12-22 LAB — SARS CORONAVIRUS 2 (TAT 6-24 HRS): SARS Coronavirus 2: NEGATIVE

## 2018-12-22 LAB — ACETAMINOPHEN LEVEL: Acetaminophen (Tylenol), Serum: 55 ug/mL — ABNORMAL HIGH (ref 10–30)

## 2018-12-22 LAB — PREGNANCY, URINE: Preg Test, Ur: NEGATIVE

## 2018-12-22 LAB — ETHANOL: Alcohol, Ethyl (B): 87 mg/dL — ABNORMAL HIGH (ref <10)

## 2018-12-22 MED ORDER — NICOTINE 21 MG/24HR TD PT24
21.0000 mg | MEDICATED_PATCH | Freq: Every day | TRANSDERMAL | Status: DC
  Administered 2018-12-22 – 2018-12-23 (×2): 21 mg via TRANSDERMAL
  Filled 2018-12-22 (×2): qty 1

## 2018-12-22 MED ORDER — LORAZEPAM 1 MG PO TABS: 0.0000 mg | ORAL_TABLET | Freq: Two times a day (BID) | ORAL | Status: DC

## 2018-12-22 MED ORDER — VITAMIN B-1 100 MG PO TABS
100.0000 mg | ORAL_TABLET | Freq: Every day | ORAL | Status: DC
  Administered 2018-12-22 – 2018-12-23 (×2): 100 mg via ORAL
  Filled 2018-12-22 (×2): qty 1

## 2018-12-22 MED ORDER — LORAZEPAM 1 MG PO TABS
0.0000 mg | ORAL_TABLET | Freq: Four times a day (QID) | ORAL | Status: DC
  Administered 2018-12-22: 1 mg via ORAL
  Administered 2018-12-23: 2 mg via ORAL
  Administered 2018-12-23: 1 mg via ORAL
  Filled 2018-12-22: qty 1
  Filled 2018-12-22: qty 2
  Filled 2018-12-22 (×2): qty 1

## 2018-12-22 MED ORDER — LORAZEPAM 2 MG/ML IJ SOLN: 0.0000 mg | Freq: Two times a day (BID) | INTRAMUSCULAR | Status: DC

## 2018-12-22 MED ORDER — POTASSIUM CHLORIDE CRYS ER 20 MEQ PO TBCR
40.0000 meq | EXTENDED_RELEASE_TABLET | Freq: Once | ORAL | Status: AC
  Administered 2018-12-22: 40 meq via ORAL
  Filled 2018-12-22: qty 2

## 2018-12-22 MED ORDER — LORAZEPAM 2 MG/ML IJ SOLN: 0.0000 mg | Freq: Four times a day (QID) | INTRAMUSCULAR | Status: DC

## 2018-12-22 MED ORDER — THIAMINE HCL 100 MG/ML IJ SOLN: 100.0000 mg | Freq: Every day | INTRAMUSCULAR | Status: DC

## 2018-12-22 NOTE — ED Triage Notes (Signed)
Pt states that she tried to kill herself by taking 9 or 10 tylenol PMs at 0100.

## 2018-12-22 NOTE — ED Notes (Signed)
Pts belongings placed in locker room.  

## 2018-12-22 NOTE — Progress Notes (Signed)
Pt meets inpatient criteria per Mordecai Maes, NP. Referral information has been sent to the following hospitals for review:  [facility] Details  [facility] Details  [facility] Details  [facility] Details  [facility] Details  [facility] Details  [facility] Details  [facility] Details  [facility]  Disposition will continue to assist with inpatient placement needs.   Audree Camel, LCSW, [place] Disposition [facility] B[facility]/TTS [phone]

## 2018-12-22 NOTE — ED Notes (Signed)
Pt wanded by security. 

## 2018-12-22 NOTE — ED Provider Notes (Signed)
[facility] EMERGENCY DEPARTMENT Provider Note   CSN: [id number] Arrival date & time: 12/22/18  3664     History   Chief Complaint Chief Complaint  Patient presents with  . Depression    HPI April [name] is a 38 y.o. female.     Patient with hx depression, c/o increased feelings of depression in the past few weeks. Denies specific inciting event. Symptoms gradual onset, moderate, persistent, slowly feels worse.  Denies any prescription medication for same. States intermittent thoughts of suicide. Tonight took 8-10 tylenol pm tablets around 0100 - states I just wanted to sleep. Denies other ingestion. States occasional etoh and cocaine use. Denies headache. No chest pain or discomfort. No sob. No cough or uri symptoms. No fever or chills. No known covid + exposure. Mildly decreased appetite, some trouble sleeping at night, no recent wt loss.   The history is provided by the patient.    Past Medical History:  Diagnosis Date  . Anxiety   . Bronchitis   . Depression   . Miscarriage     Patient Active Problem List   Diagnosis Date Noted  . MDD (major depressive disorder) 09/02/2018    Past Surgical History:  Procedure Laterality Date  . ANKLE SURGERY     left     OB History    Gravida  1   Para  1   Term  1   Preterm      AB      Living  1     SAB      TAB      Ectopic      Multiple      Live Births               Home Medications    Prior to Admission medications   Medication Sig Start Date End Date Taking? Authorizing Provider  albuterol (PROVENTIL HFA;VENTOLIN HFA) 108 (90 BASE) MCG/ACT inhaler Inhale 2 puffs into the lungs every 6 (six) hours as needed. For bronchitis     [provider]  atomoxetine (STRATTERA) 10 MG capsule Take 1 capsule (10 mg total) by mouth daily. 09/07/18   Oneta Rack, NP  gabapentin (NEURONTIN) 300 MG capsule Take 1 capsule (300 mg total) by mouth 3 (three) times daily. 09/06/18   Oneta Rack, NP   hydrOXYzine (ATARAX/VISTARIL) 25 MG tablet Take 1 tablet (25 mg total) by mouth every 6 (six) hours as needed for anxiety. 09/06/18   Oneta Rack, NP  nicotine polacrilex (NICORETTE) 2 MG gum Take 1 each (2 mg total) by mouth as needed for smoking cessation. 09/06/18   Oneta Rack, NP  Prenatal Vit-Fe Fumarate-FA (PRENATAL MULTIVITAMIN) TABS tablet Take 1 tablet by mouth daily at 12 noon. 09/06/18   Oneta Rack, NP  traZODone (DESYREL) 50 MG tablet Take 1 tablet (50 mg total) by mouth at bedtime as needed for sleep. 09/06/18   Oneta Rack, NP    Family History Family History  Problem Relation Age of Onset  . Hypertension Mother   . Diabetes Other   . Cancer Other   . Hypertension Other     Social History Social History   Tobacco Use  . Smoking status: Current Every Day Smoker    Packs/day: 0.50    Years: 12.00    Pack years: 6.00    Types: Cigarettes  . Smokeless tobacco: Never Used  Substance Use Topics  . Alcohol use: Yes  Alcohol/week: 11.0 standard drinks    Types: 11 Cans of beer per week    Comment: daily  . Drug use: Yes    Types: Cocaine    Comment: last use 12/21/18     Allergies   Other   Review of Systems Review of Systems  Constitutional: Negative for chills and fever.  HENT: Negative for sore throat.   Eyes: Negative for redness.  Respiratory: Negative for cough and shortness of breath.   Cardiovascular: Negative for chest pain.  Gastrointestinal: Negative for abdominal pain, nausea and vomiting.  Genitourinary: Negative for flank pain.  Musculoskeletal: Negative for back pain and neck pain.  Skin: Negative for rash.  Neurological: Negative for headaches.  Hematological: Does not bruise/bleed easily.  Psychiatric/Behavioral: Positive for dysphoric mood and suicidal ideas.     Physical Exam Updated Vital Signs BP (!) 144/115   Pulse (!) 106   Temp (!) 97.2 F (36.2 C)   Resp 18   Ht 1.626 m (5\' 4" )   Wt 54.4 kg   SpO2 100%    BMI 20.60 kg/m   Physical Exam Vitals signs and nursing note reviewed.  Constitutional:      Appearance: Normal appearance. She is well-developed.  HENT:     Head: Atraumatic.     Nose: Nose normal.     Mouth/Throat:     Mouth: Mucous membranes are moist.  Eyes:     General: No scleral icterus.    Conjunctiva/sclera: Conjunctivae normal.     Pupils: Pupils are equal, round, and reactive to light.  Neck:     Musculoskeletal: Normal range of motion and neck supple. No neck rigidity or muscular tenderness.     Trachea: No tracheal deviation.  Cardiovascular:     Rate and Rhythm: Normal rate and regular rhythm.     Pulses: Normal pulses.     Heart sounds: Normal heart sounds. No murmur. No friction rub. No gallop.   Pulmonary:     Effort: Pulmonary effort is normal. No respiratory distress.     Breath sounds: Normal breath sounds.  Abdominal:     General: Bowel sounds are normal. There is no distension.     Palpations: Abdomen is soft.     Tenderness: There is no abdominal tenderness. There is no guarding.  Genitourinary:    Comments: No cva tenderness.  Musculoskeletal:        General: No swelling.  Skin:    General: Skin is warm and dry.     Findings: No rash.  Neurological:     Mental Status: She is alert.     Comments: Alert, speech normal. Motor/sens grossly intact bil. Steady gait.   Psychiatric:     Comments: Depressed mood. +SI.       ED Treatments / Results  Labs (all labs ordered are listed, but only abnormal results are displayed) Results for orders placed or performed during the hospital encounter of 12/22/18  CBC  Result Value Ref Range   WBC 6.7 4.0 - 10.5 K/uL   RBC 4.60 3.87 - 5.11 MIL/uL   Hemoglobin 14.8 12.0 - 15.0 g/dL   HCT 16.145.3 09.636.0 - 04.546.0 %   MCV 98.5 80.0 - 100.0 fL   MCH 32.2 26.0 - 34.0 pg   MCHC 32.7 30.0 - 36.0 g/dL   RDW 40.913.0 81.111.5 - 91.415.5 %   Platelets 410 (H) 150 - 400 K/uL   nRBC 0.0 0.0 - 0.2 %  CMET  Result Value Ref Range  Sodium 143 135 - 145 mmol/L   Potassium 3.4 (L) 3.5 - 5.1 mmol/L   Chloride 107 98 - 111 mmol/L   CO2 26 22 - 32 mmol/L   Glucose, Bld 65 (L) 70 - 99 mg/dL   BUN 6 6 - 20 mg/dL   Creatinine, Ser 0.73 0.44 - 1.00 mg/dL   Calcium 8.5 (L) 8.9 - 10.3 mg/dL   Total Protein 7.2 6.5 - 8.1 g/dL   Albumin 4.0 3.5 - 5.0 g/dL   AST 17 15 - 41 U/L   ALT 12 0 - 44 U/L   Alkaline Phosphatase 71 38 - 126 U/L   Total Bilirubin 0.2 (L) 0.3 - 1.2 mg/dL   GFR calc non Af Amer >60 >60 mL/min   GFR calc Af Amer >60 >60 mL/min   Anion gap 10 5 - 15  Etoh  Result Value Ref Range   Alcohol, Ethyl (B) 87 (H) <10 mg/dL  Acetaminophen level  Result Value Ref Range   Acetaminophen (Tylenol), Serum 55 (H) 10 - 30 ug/mL  Urine rapid drug screen  Result Value Ref Range   Opiates NONE DETECTED NONE DETECTED   Cocaine POSITIVE (A) NONE DETECTED   Benzodiazepines NONE DETECTED NONE DETECTED   Amphetamines NONE DETECTED NONE DETECTED   Tetrahydrocannabinol NONE DETECTED NONE DETECTED   Barbiturates NONE DETECTED NONE DETECTED  Urine pregnancy  Result Value Ref Range   Preg Test, Ur NEGATIVE NEGATIVE  Salicylate level  Result Value Ref Range   Salicylate Lvl <6.8 2.8 - 30.0 mg/dL    EKG EKG Interpretation  Date/Time:  Tuesday December 22 2018 06:23:53 EST Ventricular Rate:  87 PR Interval:    QRS Duration: 88 QT Interval:  376 QTC Calculation: 453 R Axis:   82 Text Interpretation: Sinus rhythm No previous tracing Confirmed by Lajean Saver [phone]) on 12/22/2018 6:33:57 AM   Radiology No results found.  Procedures Procedures (including critical care time)  Medications Ordered in ED Medications - No data to display   Initial Impression / Assessment and Plan / ED Course  I have reviewed the triage vital signs and the nursing notes.  Pertinent labs & imaging results that were available during my care of the patient were reviewed by me and considered in my medical decision making (see chart  for details).  Iv ns. Labs sent. ecg.  Reviewed nursing notes and prior charts for additional history.   [place] team consulted.   Patient reaffirms that she took the tylenol pm at 1 AM today. Patients 4 hr acetaminophen level is not in toxic range.   Labs reviewed/interpreted by me - chem normal except k sl low, glucose sl low. Po fluids/food.   TTS evaluation pending.  Disposition per [facility] team.     Final Clinical Impressions(s) / ED Diagnoses   Final diagnoses:  None    ED Discharge Orders    None       Lajean Saver, MD 12/22/18 [phone]

## 2018-12-22 NOTE — BH Assessment (Addendum)
Tele Assessment Note   Patient Name: April April [name] Beazer MRN: [id number] Referring Physician: Cathren LaineSteinl, Kevin, MD Location of Patient: [facility] Location of Provider: Behavioral Health TTS Department  April April [name] Allinson is April [name] single 38 y.o. female.who presents voluntarily to [facility]. Pt is reporting symptoms of depression with suicidal attempt. Pt reports she took 8-10 tylenol and went into the woods so no one would have to find her body. She states she had also planned to hang herself but didn't carry the plan through. Pt has April [name] history of depression, past suicide attempts and substance abuse. Pt reports no current medication, she states she has taken rx medications in the past. Pt reports ongoing suicidal ideation with no current plan. She reports 7 past suicide attempts. Pt acknowledges multiple symptoms of Depression, including anhedonia, isolating, feelings of worthlessness & guilt, tearfulness, & changes in sleep & appetite. Pt denies homicidal ideation/ history of violence. Pt denies auditory & visual hallucinations & other symptoms of psychosis. Pt states current stressors include recent break up with boyfriend. Pt lived with boyfriend and his mother. Pt states she will live with April [name] friend. Pt became tearful while reporting April [name] relapse on alcohol and cocaine after 3 months of sobriety. She also cried as she shared she had April [name] recent miscarriage.   Pt reports her mother is April [name] support for her. Pt denies hx of abuse and trauma. Pt reports there is April [name] strong family history of substance abuse.  Pt is unemployed. Pt has fair insight and impaired judgment. Pt's memory is intact. Legal history includes no charges.  Protective factors against suicide include  no access to firearms & no current psychotic symptoms.?  Pt's OP history includes tx in distant past. IP history includes [facility], last admission 08/2018. Pt reports daily alcohol use of 8-10 beers and daily crack cocaine use. ? MSE: Pt is disheveled, just waking up, oriented  x4 with soft speech at April [name] slow pace and normal motor behavior. Eye contact is good. Pt's mood is depressed and affect is depressed & constricted. Affect is congruent with mood. Thought process is coherent and relevant. There is no indication pt is currently responding to internal stimuli or experiencing delusional thought content. Pt was cooperative throughout assessment.    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 Anxiety   . Bronchitis   . Depression   . Miscarriage     Past Surgical History:  Procedure Laterality Date  . ANKLE SURGERY     left    Family History:  Family History  Problem Relation Age of Onset  . Hypertension Mother   . Diabetes Other   . Cancer Other   . Hypertension Other     Social History:  reports that she has been smoking cigarettes. She has April [name] 6.00 pack-year smoking history. She has never used smokeless tobacco. She reports current alcohol use of about 11.0 standard drinks of alcohol per week. She reports current drug use. Drug: Cocaine.  Additional Social History:  Alcohol / Drug Use Pain Medications: None reported Prescriptions: none reported Over the Counter: none on April [name] regular basis reported History of alcohol / drug use?: Yes Substance #1 Name of Substance 1: alcohol 1 - Age of First Use: 17 1 - Amount (size/oz): at least 8 or 9 beers 1 - Frequency: daily 1 - Duration: 2 years 1 - Last Use /  Amount: 12/21/2018 9pm Substance #2 Name of Substance 2: cocaine- smoking 2 - Age of First Use: 36 2 - Frequency: daily 2 - Duration: 2 years 2 - Last Use / Amount: 12/21/2018  CIWA: CIWA-Ar BP: (!) 128/97 Pulse Rate: 77 COWS:    Allergies:  Allergies  Allergen Reactions  . Other Hives    Cashews    Home Medications: (Not in April [name] hospital admission)   OB/GYN  Status:  No LMP recorded.  General Assessment Data Location of Assessment: [facility] TTS Assessment: In system Is this April [name] Tele or Face-to-Face Assessment?: Tele Assessment Is this an Initial Assessment or April [name] Re-assessment for this encounter?: Initial Assessment Language Other than English: No Living Arrangements: Other (Comment) What gender do you identify as?: Female Marital status: Single Living Arrangements: Non-relatives/Friends Can pt return to current living arrangement?: No Admission Status: Voluntary Is patient capable of signing voluntary admission?: Yes Referral Source: Self/Family/Friend Insurance type: none     Crisis Care Plan Living Arrangements: Non-relatives/Friends Name of Psychiatrist: none Name of Therapist: none  Education Status Is patient currently in school?: No Is the patient employed, unemployed or receiving disability?: Unemployed  Risk to self with the past 6 months Suicidal Ideation: Yes-Currently Present Has patient been April [name] risk to self within the past 6 months prior to admission? : Yes Suicidal Intent: No-Not Currently/Within Last 6 Months Has patient had any suicidal intent within the past 6 months prior to admission? : Yes Is patient at risk for suicide?: Yes Suicidal Plan?: No-Not Currently/Within Last 6 Months Has patient had any suicidal plan within the past 6 months prior to admission? : Yes What has been your use of drugs/alcohol within the last 12 months?: daily cocaine and alcohol Previous Attempts/Gestures: Yes How many times?: 7 Other Self Harm Risks: recent losses; homeless; recent breakup, previous attempts Triggers for Past Attempts: Unpredictable, Other (Comment)(substance abuse) Intentional Self Injurious Behavior: None Family Suicide History: Yes(paternal grandfather) Recent stressful life event(s): Loss (Comment), Turmoil (Comment), Financial Problems Persecutory voices/beliefs?: No Depression: Yes Depression Symptoms:  Despondent, Tearfulness, Isolating, Fatigue, Guilt, Loss of interest in usual pleasures, Feeling worthless/self pity, Insomnia Substance abuse history and/or treatment for substance abuse?: Yes Suicide prevention information given to non-admitted patients: Not applicable  Risk to Others within the past 6 months Homicidal Ideation: No Does patient have any lifetime risk of violence toward others beyond the six months prior to admission? : No Thoughts of Harm to Others: No Current Homicidal Intent: No Current Homicidal Plan: No Access to Homicidal Means: No History of harm to others?: No Assessment of Violence: None Noted Does patient have access to weapons?: No Criminal Charges Pending?: No Does patient have April [name] court date: No Is patient on probation?: No  Psychosis Hallucinations: None noted Delusions: None noted  Mental Status Report Appearance/Hygiene: Disheveled Eye Contact: Good Motor Activity: Freedom of movement Speech: Logical/coherent Level of Consciousness: Crying Mood: Depressed Affect: Constricted Anxiety Level: Minimal Thought Processes: Coherent, Relevant Judgement: Impaired Orientation: Appropriate for developmental age Obsessive Compulsive Thoughts/Behaviors: None  Cognitive Functioning Concentration: Fair Memory: Recent Intact, Remote Intact Is patient IDD: No Impulse Control: Fair Appetite: Poor Have you had any weight changes? : Loss Amount of the weight change? (lbs): 17 lbs("April [name] lot- 15-20lbs") Sleep: Decreased Total Hours of Sleep: 2 Vegetative Symptoms: Decreased grooming, Not bathing, Staying in bed  ADLScreening [facility] Assessment Services) Patient's cognitive ability adequate to safely complete daily activities?: Yes Patient able to express need for assistance with ADLs?: Yes Independently performs ADLs?: Yes (  appropriate for developmental age)  Prior Inpatient Therapy Prior Inpatient Therapy: Yes Prior Therapy Dates: (unknown)  Prior  Outpatient Therapy Prior Outpatient Therapy: Yes Prior Therapy Dates: 08/2018 Prior Therapy Facilty/Provider(s): [facility] Reason for Treatment: Depression, SI Does patient have an ACCT team?: No Does patient have Intensive In-House Services?  : No Does patient have [facility] services? : No Does patient have P4CC services?: No  ADL Screening (condition at time of admission) Patient's cognitive ability adequate to safely complete daily activities?: Yes Is the patient deaf or have difficulty hearing?: No Does the patient have difficulty seeing, even when wearing glasses/contacts?: No Does the patient have difficulty concentrating, remembering, or making decisions?: No Patient able to express need for assistance with ADLs?: Yes Does the patient have difficulty dressing or bathing?: No Independently performs ADLs?: Yes (appropriate for developmental age) Does the patient have difficulty walking or climbing stairs?: No Weakness of Legs: None Weakness of Arms/Hands: None  Home Assistive Devices/Equipment Home Assistive Devices/Equipment: None  Therapy Consults (therapy consults require April [name] physician order) PT Evaluation Needed: No OT Evalulation Needed: No SLP Evaluation Needed: No Abuse/Neglect Assessment (Assessment to be complete while patient is alone) Abuse/Neglect Assessment Can Be Completed: Yes Physical Abuse: Denies Verbal Abuse: Denies Sexual Abuse: Denies Exploitation of patient/patient's resources: Denies Self-Neglect: Denies Values / Beliefs Cultural Requests During Hospitalization: None Spiritual Requests During Hospitalization: None Consults Spiritual Care Consult Needed: No Social Work Consult Needed: No Regulatory affairs officer (For Healthcare) Does Patient Have April [name] Medical Advance Directive?: No Would patient like information on creating April [name] medical advance directive?: No - Patient declined          Disposition: LaShunda Thomas,NP recommends inpt psychiatric  admission Disposition Initial Assessment Completed for this Encounter: Yes  This service was provided via telemedicine using April [name] 2-way, interactive audio and video technology.  Names of all persons participating in this telemedicine service and their role in this encounter. Name: Ignacia Marvel, lcsw Role: tts  Name: Aryanah Enslow Role: pt  Name:  Role:   Name:  Role:     [place] 12/22/2018 10:09 AM

## 2018-12-23 ENCOUNTER — Encounter (HOSPITAL_COMMUNITY): Payer: Self-pay | Admitting: Emergency Medicine

## 2018-12-23 ENCOUNTER — Inpatient Hospital Stay (HOSPITAL_COMMUNITY)
Admission: AD | Admit: 2018-12-23 | Discharge: 2018-12-29 | DRG: 881 | Disposition: A | Discharge status: Home / Self Care | Payer: Federal, State, Local not specified - Other | Source: Intra-hospital | Attending: Psychiatry | Admitting: Psychiatry

## 2018-12-23 ENCOUNTER — Other Ambulatory Visit: Payer: Self-pay

## 2018-12-23 DIAGNOSIS — F1424 Cocaine dependence with cocaine-induced mood disorder: Secondary | ICD-10-CM | POA: Diagnosis present

## 2018-12-23 DIAGNOSIS — F329 Major depressive disorder, single episode, unspecified: Secondary | ICD-10-CM | POA: Diagnosis present

## 2018-12-23 DIAGNOSIS — T391X2A Poisoning by 4-Aminophenol derivatives, intentional self-harm, initial encounter: Secondary | ICD-10-CM | POA: Diagnosis present

## 2018-12-23 DIAGNOSIS — F17210 Nicotine dependence, cigarettes, uncomplicated: Secondary | ICD-10-CM | POA: Diagnosis present

## 2018-12-23 DIAGNOSIS — M62838 Other muscle spasm: Secondary | ICD-10-CM | POA: Diagnosis present

## 2018-12-23 DIAGNOSIS — R45851 Suicidal ideations: Secondary | ICD-10-CM | POA: Diagnosis present

## 2018-12-23 DIAGNOSIS — F1920 Other psychoactive substance dependence, uncomplicated: Secondary | ICD-10-CM | POA: Diagnosis present

## 2018-12-23 DIAGNOSIS — F419 Anxiety disorder, unspecified: Secondary | ICD-10-CM | POA: Diagnosis present

## 2018-12-23 DIAGNOSIS — G4700 Insomnia, unspecified: Secondary | ICD-10-CM | POA: Diagnosis present

## 2018-12-23 DIAGNOSIS — F1994 Other psychoactive substance use, unspecified with psychoactive substance-induced mood disorder: Secondary | ICD-10-CM | POA: Diagnosis not present

## 2018-12-23 DIAGNOSIS — F332 Major depressive disorder, recurrent severe without psychotic features: Secondary | ICD-10-CM | POA: Diagnosis not present

## 2018-12-23 MED ORDER — GABAPENTIN 300 MG PO CAPS
300.0000 mg | ORAL_CAPSULE | Freq: Three times a day (TID) | ORAL | Status: DC
  Administered 2018-12-23: 300 mg via ORAL
  Filled 2018-12-23: qty 1

## 2018-12-23 MED ORDER — NICOTINE POLACRILEX 2 MG MT GUM: 2.0000 mg | CHEWING_GUM | OROMUCOSAL | Status: DC | PRN

## 2018-12-23 MED ORDER — ALBUTEROL SULFATE HFA 108 (90 BASE) MCG/ACT IN AERS: 2.0000 | INHALATION_SPRAY | Freq: Four times a day (QID) | RESPIRATORY_TRACT | Status: DC | PRN

## 2018-12-23 MED ORDER — TRAZODONE HCL 50 MG PO TABS: 50.0000 mg | ORAL_TABLET | Freq: Every evening | ORAL | Status: DC | PRN

## 2018-12-23 MED ORDER — ATOMOXETINE HCL 10 MG PO CAPS
10.0000 mg | ORAL_CAPSULE | Freq: Every day | ORAL | Status: DC
  Filled 2018-12-23 (×3): qty 1

## 2018-12-23 MED ORDER — PRENATAL MULTIVITAMIN CH
1.0000 | ORAL_TABLET | Freq: Every day | ORAL | Status: DC
  Administered 2018-12-23: 1 via ORAL
  Filled 2018-12-23 (×4): qty 1

## 2018-12-23 MED ORDER — LORAZEPAM 1 MG PO TABS: 0.0000 mg | ORAL_TABLET | Freq: Four times a day (QID) | ORAL | Status: DC

## 2018-12-23 MED ORDER — LORAZEPAM 2 MG/ML IJ SOLN: 0.0000 mg | Freq: Two times a day (BID) | INTRAMUSCULAR | Status: DC

## 2018-12-23 MED ORDER — HYDROXYZINE HCL 25 MG PO TABS
25.0000 mg | ORAL_TABLET | Freq: Four times a day (QID) | ORAL | Status: DC | PRN
  Administered 2018-12-23 – 2018-12-29 (×13): 25 mg via ORAL
  Filled 2018-12-23 (×13): qty 1

## 2018-12-23 MED ORDER — NICOTINE POLACRILEX 2 MG MT GUM
2.0000 mg | CHEWING_GUM | OROMUCOSAL | Status: DC | PRN
  Filled 2018-12-23: qty 1

## 2018-12-23 MED ORDER — GABAPENTIN 300 MG PO CAPS
300.0000 mg | ORAL_CAPSULE | Freq: Three times a day (TID) | ORAL | Status: DC
  Administered 2018-12-23 – 2018-12-29 (×17): 300 mg via ORAL
  Filled 2018-12-23 (×21): qty 1

## 2018-12-23 MED ORDER — TRAZODONE HCL 50 MG PO TABS
50.0000 mg | ORAL_TABLET | Freq: Every evening | ORAL | Status: DC | PRN
  Administered 2018-12-23 – 2018-12-28 (×6): 50 mg via ORAL
  Filled 2018-12-23 (×7): qty 1

## 2018-12-23 MED ORDER — LORAZEPAM 2 MG/ML IJ SOLN: 0.0000 mg | Freq: Four times a day (QID) | INTRAMUSCULAR | Status: DC

## 2018-12-23 MED ORDER — PRENATAL MULTIVITAMIN CH
1.0000 | ORAL_TABLET | Freq: Every day | ORAL | Status: DC
  Administered 2018-12-24 – 2018-12-28 (×5): 1 via ORAL
  Filled 2018-12-23 (×6): qty 1

## 2018-12-23 MED ORDER — NICOTINE 21 MG/24HR TD PT24
21.0000 mg | MEDICATED_PATCH | Freq: Every day | TRANSDERMAL | Status: DC
  Administered 2018-12-24 – 2018-12-29 (×6): 21 mg via TRANSDERMAL
  Filled 2018-12-23 (×8): qty 1

## 2018-12-23 MED ORDER — HYDROXYZINE HCL 25 MG PO TABS: 25.0000 mg | ORAL_TABLET | Freq: Four times a day (QID) | ORAL | Status: DC | PRN

## 2018-12-23 MED ORDER — LORAZEPAM 1 MG PO TABS: 0.0000 mg | ORAL_TABLET | Freq: Two times a day (BID) | ORAL | Status: DC

## 2018-12-23 MED ORDER — LORAZEPAM 1 MG PO TABS
1.0000 mg | ORAL_TABLET | Freq: Four times a day (QID) | ORAL | Status: DC | PRN
  Administered 2018-12-23 – 2018-12-25 (×5): 1 mg via ORAL
  Filled 2018-12-23 (×5): qty 1

## 2018-12-23 NOTE — ED Notes (Signed)
Pt was given breakfast tray 

## 2018-12-23 NOTE — Progress Notes (Signed)
Patient ID: April [name], female   DOB: 1980/10/25, 38 y.o.   MRN: [id number]    [place] NOVEL CORONAVIRUS (COVID-19) DAILY CHECK-OFF SYMPTOMS - answer yes or no to each - every day NO YES  Have you had a fever in the past 24 hours?  . Fever (Temp > 37.80C / 100F) X   Have you had any of these symptoms in the past 24 hours? . New Cough .  Sore Throat  .  Shortness of Breath .  Difficulty Breathing .  Unexplained Body Aches   X   Have you had any one of these symptoms in the past 24 hours not related to allergies?   . Runny Nose .  Nasal Congestion .  Sneezing   X   If you have had runny nose, nasal congestion, sneezing in the past 24 hours, has it worsened?  X   EXPOSURES - check yes or no X   Have you traveled outside the state in the past 14 days?  X   Have you been in contact with someone with a confirmed diagnosis of COVID-19 or PUI in the past 14 days without wearing appropriate PPE?  X   Have you been living in the same home as a person with confirmed diagnosis of COVID-19 or a PUI (household contact)?    X   Have you been diagnosed with COVID-19?    X              What to do next: Answered NO to all: Answered YES to anything:   Proceed with unit schedule Follow the [facility] Inpatient Flowsheet.

## 2018-12-23 NOTE — Progress Notes (Signed)
Patient ID: April [name], female   DOB: July 20, 1980, 38 y.o.   MRN: [id number]  D: Pt alert and oriented during [facility] admission process. Pt denies SI/HI, A/VH, and any pain. Pt is cooperative.  "April [name] is a single 38 y.o. female.who presents voluntarily to April [name]. Pt is reporting symptoms of depression with suicidal attempt. Pt reports she took 8-10 tylenol and went into the woods so no one would have to find her body. She states she had also planned to hang herself but didn't carry the plan through. Pt has a history of depression, past suicide attempts and substance abuse. Pt reports no current medication, she states she has taken rx medications in the past. Pt reports ongoing suicidal ideation with no current plan. She reports 7 past suicide attempts. Pt acknowledges multiple symptoms of Depression, including anhedonia, isolating, feelings of worthlessness & guilt, tearfulness, & changes in sleep & appetite. Pt denies homicidal ideation/ history of violence. Pt denies auditory & visual hallucinations & other symptoms of psychosis. Pt states current stressors include recent break up with boyfriend. Pt lived with boyfriend and his mother. Pt states she will live with a friend. Pt became tearful while reporting a relapse on alcohol and cocaine after 3 months of sobriety. She also cried as she shared she had a recent miscarriage."  A: Education, support, reassurance, and encouragement provided, q15 minute safety checks initiated. Pt's belongings in locker # 70.    R: Pt denies any concerns at this time, and verbally contracts for safety. Pt ambulating on the unit with no issues. Pt remains safe on and off the unit.

## 2018-12-23 NOTE — Tx Team (Signed)
Initial Treatment Plan 12/23/2018 8:48 PM April [name] KGY:[id number]    PATIENT STRESSORS: Loss of fetus from miscarriage in October 2020 Substance abuse   PATIENT STRENGTHS: Ability for insight Communication skills Motivation for treatment/growth Supportive family/friends   PATIENT IDENTIFIED PROBLEMS: Depression  Grieving "from a miscarriage in October 2020"  Substance abuse  Anger  Panic attacks  "trouble sleeping"           DISCHARGE CRITERIA:  Improved stabilization in mood, thinking, and/or behavior Verbal commitment to aftercare and medication compliance Withdrawal symptoms are absent or subacute and managed without 24-hour nursing intervention  PRELIMINARY DISCHARGE PLAN: Outpatient therapy Return to previous living arrangement  PATIENT/FAMILY INVOLVEMENT: This treatment plan has been presented to and reviewed with the patient, April [name], and/or family member.  The patient and family have been given the opportunity to ask questions and make suggestions.  Harriet Masson, RN 12/23/2018, 8:48 PM

## 2018-12-23 NOTE — Progress Notes (Signed)
Pt accepted to [facility] ([place]); bed 303-2   Dr. Dwyane Dee is the accepting provider.    Dr. Mallie Darting is the attending provider.    Call report to [phone]    Daphyna @ [facility] notified.     Pt is voluntary and can be transported by [facility].  Pt is scheduled to arrive at [facility] at [place], [place], [place] Disposition CSW [facility]/TTS [phone]

## 2018-12-24 DIAGNOSIS — F1424 Cocaine dependence with cocaine-induced mood disorder: Secondary | ICD-10-CM

## 2018-12-24 DIAGNOSIS — F1994 Other psychoactive substance use, unspecified with psychoactive substance-induced mood disorder: Secondary | ICD-10-CM

## 2018-12-24 LAB — HEMOGLOBIN A1C
Hgb A1c MFr Bld: 5 % (ref 4.8–5.6)
Mean Plasma Glucose: 96.8 mg/dL

## 2018-12-24 LAB — ACETAMINOPHEN LEVEL: Acetaminophen (Tylenol), Serum: <10 ug/mL — ABNORMAL LOW (ref 10–30)

## 2018-12-24 LAB — LIPID PANEL
Cholesterol: 155 mg/dL (ref 0–200)
HDL: 56 mg/dL (ref >40)
LDL Cholesterol: 79 mg/dL (ref 0–99)
Total CHOL/HDL Ratio: 2.8 RATIO
Triglycerides: 102 mg/dL (ref <150)
VLDL: 20 mg/dL (ref 0–40)

## 2018-12-24 LAB — TSH: TSH: 1.601 u[IU]/mL (ref 0.350–4.500)

## 2018-12-24 MED ORDER — LIDOCAINE 5 % EX PTCH
1.0000 | MEDICATED_PATCH | CUTANEOUS | Status: DC
  Administered 2018-12-24 – 2018-12-27 (×4): 1 via TRANSDERMAL
  Filled 2018-12-24 (×8): qty 1

## 2018-12-24 MED ORDER — ENSURE ENLIVE PO LIQD
237.0000 mL | Freq: Two times a day (BID) | ORAL | Status: DC
  Administered 2018-12-24 – 2018-12-28 (×8): 237 mL via ORAL

## 2018-12-24 MED ORDER — IBUPROFEN 400 MG PO TABS
400.0000 mg | ORAL_TABLET | Freq: Four times a day (QID) | ORAL | Status: DC | PRN
  Administered 2018-12-24 – 2018-12-29 (×11): 400 mg via ORAL
  Filled 2018-12-24 (×11): qty 1

## 2018-12-24 MED ORDER — CYCLOBENZAPRINE HCL 10 MG PO TABS
10.0000 mg | ORAL_TABLET | Freq: Three times a day (TID) | ORAL | Status: DC | PRN
  Administered 2018-12-24 – 2018-12-28 (×5): 10 mg via ORAL
  Filled 2018-12-24 (×5): qty 1

## 2018-12-24 NOTE — [facility] Counselor (Signed)
CSW faxed [facility] referral to [facility] and [facility].  CSW received confirmation that both faxes were successful.   Assunta Curtis, MSW, LCSW 12/24/2018 3:05 PM

## 2018-12-24 NOTE — Progress Notes (Signed)
Patient Self Inventory Sheet, Pt reports fair appetite, energy low, and good sleeping pattern. Pt rates her depression 8 out of 10, hopelessness 9  out of 10, and anxiety at 7 out of 10. Pt  Refused to answer questions of SI/ HI.  Pt reports his physical problems includes  cravings, agitation, runny nose, chilling, nausea, irritability  and pain at  10 out of 10. Her pain is located in back, and head areas. Pt denies using any medications for pain. Pt states her most important goal is to get out of pain . Medication given as Rx, safety maintained with 15 minute checks. Vital signs monitored.
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
## 2018-12-24 NOTE — Progress Notes (Signed)
NUTRITION ASSESSMENT  Pt identified as at risk on the Malnutrition Screen Tool  INTERVENTION: 1. Supplements: Ensure Enlive po BID, each supplement provides 350 kcal and 20 grams of protein  NUTRITION DIAGNOSIS: Unintentional weight loss related to sub-optimal intake as evidenced by pt report.   Goal: Pt to meet >/= 90% of their estimated nutrition needs.  Monitor:  PO intake  Assessment:  Pt admitted with depression, SA and substance abuse. Pt reports she started drinking ETOH and using cocaine daily. Pt drinks ~8-10 beers daily. Per weight records, pt has lost 8 lbs since 8/11 (6% wt loss x 4 months, insignificant for time frame).  Will order Ensure supplements. Pt has already been ordered a daily MVI (prenatal).  Height: Ht Readings from Last 1 Encounters:  12/23/18 5\' 4"  (1.626 m)    Weight: Wt Readings from Last 1 Encounters:  12/23/18 54.4 kg    Weight Hx: Wt Readings from Last 10 Encounters:  12/23/18 54.4 kg  12/22/18 54.4 kg  09/02/18 58 kg  09/01/18 58.1 kg  01/20/11 64 kg    BMI:  Body mass index is 20.6 kg/m. Pt meets criteria for normal based on current BMI.  Estimated Nutritional Needs: Kcal: 25-30 kcal/kg Protein: > 1 gram protein/kg Fluid: 1 ml/kcal  Diet Order:  Diet Order            Diet regular Room service appropriate? No; Fluid consistency: Thin; Fluid restriction: 2000 mL Fluid  Diet effective now             Pt is also offered choice of unit snacks mid-morning and mid-afternoon.  Pt is eating as desired.   Lab results and medications reviewed.   Clayton Bibles, MS, RD, LDN Inpatient Clinical Dietitian Pager: [phone] After Hours Pager: [phone]

## 2018-12-24 NOTE — H&P (Signed)
Psychiatric Admission Assessment Adult  Patient Identification: April [name] MRN:  [id number] Date of Evaluation:  12/24/2018 Chief Complaint:  Depression With Suicidal Ideation Principal Diagnosis: <principal problem not specified> Diagnosis:  Active Problems:   MDD (major depressive disorder)  History of Present Illness: Patient is seen and examined.  Patient is a 38 year old female with a past psychiatric history significant for cocaine dependence, alcohol dependence, and most likely opiate use disorder who presented to the [facility] emergency department on 12/22/2018 after an intentional Tylenol overdose.  The patient stated she had plan to hang herself, but did not carry out the plan.  She stated that she had been living in the home of her boyfriend and his mother, but was unable to return to that home.  She stated that after her discharge from the hospital in 08/2018 she did not follow-up or go to any residential or outpatient substance abuse treatment program.  Her laboratories revealed an elevated Tylenol level at 55 in their emergency department.  Her drug screen was positive for cocaine.  She was transferred to our facility for continued treatment.  Associated Signs/Symptoms: Depression Symptoms:  depressed mood, anhedonia, insomnia, psychomotor agitation, fatigue, feelings of worthlessness/guilt, difficulty concentrating, hopelessness, suicidal thoughts with specific plan, suicidal attempt, anxiety, loss of energy/fatigue, disturbed sleep, (Hypo) Manic Symptoms:  Impulsivity, Irritable Mood, Labiality of Mood, Anxiety Symptoms:  Excessive Worry, Psychotic Symptoms:  denied PTSD Symptoms: Negative Total Time spent with patient: 30 minutes  Past Psychiatric History: This is her third psychiatric hospitalization.  She was last hospitalized at our facility on 09/02/2018.  She has 1 previous admission at [facility] in the past.  She has been on multiple  antidepressant medications in the past.  Is the patient at risk to self? Yes.    Has the patient been a risk to self in the past 6 months? Yes.    Has the patient been a risk to self within the distant past? Yes.    Is the patient a risk to others? No.  Has the patient been a risk to others in the past 6 months? No.  Has the patient been a risk to others within the distant past? No.   Prior Inpatient Therapy:   Prior Outpatient Therapy:    Alcohol Screening: 1. How often do you have a drink containing alcohol?: 4 or more times a week 2. How many drinks containing alcohol do you have on a typical day when you are drinking?: 7, 8, or 9 3. How often do you have six or more drinks on one occasion?: Daily or almost daily AUDIT-C Score: 11 4. How often during the last year have you found that you were not able to stop drinking once you had started?: Daily or almost daily 5. How often during the last year have you failed to do what was normally expected from you becasue of drinking?: Weekly 6. How often during the last year have you needed a first drink in the morning to get yourself going after a heavy drinking session?: Weekly 7. How often during the last year have you had a feeling of guilt of remorse after drinking?: Weekly 8. How often during the last year have you been unable to remember what happened the night before because you had been drinking?: Monthly 9. Have you or someone else been injured as a result of your drinking?: No 10. Has a relative or friend or a doctor or another health worker been concerned about your  drinking or suggested you cut down?: Yes, but not in the last year Alcohol Use Disorder Identification Test Final Score (AUDIT): 28 Alcohol Brief Interventions/Follow-up: Patient Refused, Alcohol Education, Medication Offered/Prescribed Substance Abuse History in the last 12 months:  No. Consequences of Substance Abuse: Withdrawal Symptoms:    Cramps Diaphoresis Diarrhea Headaches Nausea Tremors Vomiting Previous Psychotropic Medications: Yes  Psychological Evaluations: Yes  Past Medical History:  Past Medical History:  Diagnosis Date  . Anxiety   . Bronchitis   . Depression   . Miscarriage     Past Surgical History:  Procedure Laterality Date  . ANKLE SURGERY     left   Family History:  Family History  Problem Relation Age of Onset  . Hypertension Mother   . Diabetes Other   . Cancer Other   . Hypertension Other    Family Psychiatric  History: Negative Tobacco Screening:   Social History:  Social History   Substance and Sexual Activity  Alcohol Use Yes  . Alcohol/week: 11.0 standard drinks  . Types: 11 Cans of beer per week   Comment: daily     Social History   Substance and Sexual Activity  Drug Use Yes  . Types: Cocaine   Comment: last use 12/21/18    Additional Social History:                           Allergies:   Allergies  Allergen Reactions  . Other Hives    Cashews   Lab Results:  Results for orders placed or performed during the hospital encounter of 12/23/18 (from the past 48 hour(s))  Hemoglobin A1c     Status: None   Collection Time: 12/24/18  6:32 AM  Result Value Ref Range   Hgb A1c MFr Bld 5.0 4.8 - 5.6 %    Comment: (NOTE) Pre diabetes:          5.7%-6.4% Diabetes:              >6.4% Glycemic control for   <7.0% adults with diabetes    Mean Plasma Glucose 96.8 mg/dL    Comment: Performed at [facility], [address] [place], [place] [address]  Lipid panel     Status: None   Collection Time: 12/24/18  6:32 AM  Result Value Ref Range   Cholesterol 155 0 - 200 mg/dL   Triglycerides 782 <956 mg/dL   HDL 56 >21 mg/dL   Total CHOL/HDL Ratio 2.8 RATIO   VLDL 20 0 - 40 mg/dL   LDL Cholesterol 79 0 - 99 mg/dL    Comment:        Total Cholesterol/HDL:CHD Risk Coronary Heart Disease Risk Table                     Men   Women  1/2 Average Risk    3.4   3.3  Average Risk       5.0   4.4  2 X Average Risk   9.6   7.1  3 X Average Risk  23.4   11.0        Use the calculated Patient Ratio above and the CHD Risk Table to determine the patient's CHD Risk.        ATP III CLASSIFICATION (LDL):  <100     mg/dL   Optimal  308-657  mg/dL   Near or Above  Optimal  130-159  mg/dL   Borderline  [id number]  mg/dL   High  >045>190     mg/dL   Very High Performed at [facility], [address] [place], [place] [address]   TSH     Status: None   Collection Time: 12/24/18  6:32 AM  Result Value Ref Range   TSH 1.601 0.350 - 4.500 uIU/mL    Comment: Performed by a 3rd Generation assay with a functional sensitivity of <=0.01 uIU/mL. Performed at [facility], [address] [place], [place] [address]     Blood Alcohol level:  Lab Results  Component Value Date   ETH 87 (H) 12/22/2018   ETH 201 (H) 09/01/2018    Metabolic Disorder Labs:  Lab Results  Component Value Date   HGBA1C 5.0 12/24/2018   MPG 96.8 12/24/2018   MPG 99.67 09/03/2018   No results found for: PROLACTIN Lab Results  Component Value Date   CHOL 155 12/24/2018   TRIG 102 12/24/2018   HDL 56 12/24/2018   CHOLHDL 2.8 12/24/2018   VLDL 20 12/24/2018   LDLCALC 79 12/24/2018    Current Medications: Current Facility-Administered Medications  Medication Dose Route Frequency Provider Last Rate Last Dose  . albuterol (VENTOLIN HFA) 108 (90 Base) MCG/ACT inhaler 2 puff  2 puff Inhalation Q6H PRN Antonieta Pertlary,  Lawson, MD      . cyclobenzaprine (FLEXERIL) tablet 10 mg  10 mg Oral TID PRN Antonieta Pertlary,  Lawson, MD   10 mg at 12/24/18 0946  . feeding supplement (ENSURE ENLIVE) (ENSURE ENLIVE) liquid 237 mL  237 mL Oral BID BM Antonieta Pertlary,  Lawson, MD   237 mL at 12/24/18 1130  . gabapentin (NEURONTIN) capsule 300 mg  300 mg Oral TID Antonieta Pertlary,  Lawson, MD   300 mg at 12/24/18 1207  . hydrOXYzine (ATARAX/VISTARIL) tablet 25 mg   25 mg Oral Q6H PRN Antonieta Pertlary,  Lawson, MD   25 mg at 12/24/18 0830  . ibuprofen (ADVIL) tablet 400 mg  400 mg Oral Q6H PRN Antonieta Pertlary,  Lawson, MD      . lidocaine (LIDODERM) 5 % 1 patch  1 patch Transdermal Q24H Antonieta Pertlary,  Lawson, MD   1 patch at 12/24/18 [phone]  . LORazepam (ATIVAN) tablet 1 mg  1 mg Oral Q6H PRN Antonieta Pertlary,  Lawson, MD   1 mg at 12/24/18 0948  . nicotine (NICODERM CQ - dosed in mg/24 hours) patch 21 mg  21 mg Transdermal Daily Antonieta Pertlary,  Lawson, MD   21 mg at 12/24/18 0813  . prenatal multivitamin tablet 1 tablet  1 tablet Oral Q1200 Antonieta Pertlary,  Lawson, MD   1 tablet at 12/24/18 1207  . traZODone (DESYREL) tablet 50 mg  50 mg Oral QHS PRN Antonieta Pertlary,  Lawson, MD   50 mg at 12/23/18 2211   PTA Medications: Medications Prior to Admission  Medication Sig Dispense Refill Last Dose  . albuterol (PROVENTIL HFA;VENTOLIN HFA) 108 (90 BASE) MCG/ACT inhaler Inhale 2 puffs into the lungs every 6 (six) hours as needed. For bronchitis      . atomoxetine (STRATTERA) 10 MG capsule Take 1 capsule (10 mg total) by mouth daily. (Patient not taking: Reported on 12/22/2018) 30 capsule 0   . gabapentin (NEURONTIN) 300 MG capsule Take 1 capsule (300 mg total) by mouth 3 (three) times daily. 30 capsule 0   . hydrOXYzine (ATARAX/VISTARIL) 25 MG tablet Take 1 tablet (25 mg total) by mouth every 6 (six) hours as needed for  anxiety. 30 tablet 0   . nicotine polacrilex (NICORETTE) 2 MG gum Take 1 each (2 mg total) by mouth as needed for smoking cessation. (Patient not taking: Reported on 12/22/2018) 100 tablet 0   . Prenatal Vit-Fe Fumarate-FA (PRENATAL MULTIVITAMIN) TABS tablet Take 1 tablet by mouth daily at 12 noon. (Patient not taking: Reported on 12/22/2018) 30 tablet 0   . traZODone (DESYREL) 50 MG tablet Take 1 tablet (50 mg total) by mouth at bedtime as needed for sleep. 30 tablet 0     Musculoskeletal: Strength & Muscle Tone: within normal limits Gait & Station: normal Patient leans:  N/A  Psychiatric Specialty Exam: Physical Exam  Nursing note and vitals reviewed. Constitutional: She is oriented to person, place, and time. She appears well-developed and well-nourished.  HENT:  Head: Normocephalic and atraumatic.  Respiratory: Effort normal.  Neurological: She is alert and oriented to person, place, and time.    ROS  Blood pressure 106/74, pulse (!) 110, temperature 98 F (36.7 C), temperature source Oral, resp. rate 18, height 5\' 4"  (1.626 m), weight 54.4 kg, SpO2 99 %.Body mass index is 20.6 kg/m.  General Appearance: Disheveled  Eye Contact:  Fair  Speech:  Normal Rate  Volume:  Normal  Mood:  Anxious  Affect:  Congruent  Thought Process:  Coherent and Descriptions of Associations: Circumstantial  Orientation:  Full (Time, Place, and Person)  Thought Content:  Logical  Suicidal Thoughts:  No  Homicidal Thoughts:  No  Memory:  Immediate;   Fair Recent;   Fair Remote;   Fair  Judgement:  Intact  Insight:  Fair  Psychomotor Activity:  Normal  Concentration:  Concentration: Fair and Attention Span: Fair  Recall:  [employer] of Knowledge:  Fair  Language:  Good  Akathisia:  Negative  Handed:  Right  AIMS (if indicated):     Assets:  Desire for Improvement Resilience  ADL's:  Intact  Cognition:  WNL  Sleep:  Number of Hours: 6.25    Treatment Plan Summary: Daily contact with patient to assess and evaluate symptoms and progress in treatment, Medication management and Plan : : Patient is seen and examined.  Patient is a 38 year old female with the above-stated past psychiatric history who is admitted secondary to polysubstance dependence as well as suicidal ideation.  She will be admitted to the hospital.  She will be integrated into the milieu.  She will be encouraged to attend groups.  She will be placed on lorazepam 1 mg p.o. every 6 hours as needed a CIWA greater than 10.  She will also receive thiamine and folic acid.  She will be restarted on her  gabapentin which she received on her last admission.  She had been discharged on Strattera, and I see no sense in continuing that for now but seeing that she has continued to abuse substances.  She complains of pain, and will be given Flexeril 10 mg p.o. 3 times daily as needed muscle spasms as well as Advil.  Her Tylenol level was elevated at [facility], and I reveal repeat that this morning.  Liver function enzymes were normal.  I have referred her to social work to discuss the possibility of residential substance abuse program.  She is essentially homeless at least at this point.  Observation Level/Precautions:  Detox 15 minute checks  Laboratory:  Chemistry Profile  Psychotherapy:    Medications:    Consultations:    Discharge Concerns:    Estimated LOS:  Other:  Physician Treatment Plan for Primary Diagnosis: <principal problem not specified> Long Term Goal(s): Improvement in symptoms so as ready for discharge  Short Term Goals: Ability to identify changes in lifestyle to reduce recurrence of condition will improve, Ability to verbalize feelings will improve, Ability to disclose and discuss suicidal ideas, Ability to demonstrate self-control will improve, Ability to identify and develop effective coping behaviors will improve, Ability to maintain clinical measurements within normal limits will improve, Compliance with prescribed medications will improve and Ability to identify triggers associated with substance abuse/mental health issues will improve  Physician Treatment Plan for Secondary Diagnosis: Active Problems:   MDD (major depressive disorder)  Long Term Goal(s): Improvement in symptoms so as ready for discharge  Short Term Goals: Ability to identify changes in lifestyle to reduce recurrence of condition will improve, Ability to verbalize feelings will improve, Ability to disclose and discuss suicidal ideas, Ability to demonstrate self-control will improve, Ability to identify  and develop effective coping behaviors will improve, Ability to maintain clinical measurements within normal limits will improve, Compliance with prescribed medications will improve and Ability to identify triggers associated with substance abuse/mental health issues will improve  I certify that inpatient services furnished can reasonably be expected to improve the patient's condition.    Antonieta Pert, MD 12/3/20202:48 PM

## 2018-12-24 NOTE — [facility] Group Notes (Signed)
Adult Psychoeducational Group Note  Date:  12/24/2018 Time:  10:06 PM  Group Topic/Focus:  Wrap-Up Group:   The focus of this group is to help patients review their daily goal of treatment and discuss progress on daily workbooks.  Participation Level:  Active  Participation Quality:  Appropriate and Attentive  Affect:  Appropriate  Cognitive:  Alert and Appropriate  Insight: Appropriate and Good  Engagement in Group:  Engaged  Modes of Intervention:  Discussion and Education  Additional Comments:  Pt attended and participated in wrap up group this evening and rated their day a 4/10. Pt has had ongoing back pain since they have been here and they have not received desired medications for it. Pt has an ongoing going to get their medications managed.   Cristi Loron 12/24/2018, 10:06 PM

## 2018-12-24 NOTE — BHH Suicide Risk Assessment (Signed)
[facility] Admission Suicide Risk Assessment   Nursing information obtained from:  Patient Demographic factors:  Caucasian Current Mental Status:  Self-harm behaviors Loss Factors:  Loss of significant relationship(micarriage on October 28, 2018) Historical Factors:  Impulsivity Risk Reduction Factors:  Living with another person, especially a relative, Positive social support  Total Time spent with patient: 30 minutes Principal Problem: <principal problem not specified> Diagnosis:  Active Problems:   MDD (major depressive disorder)  Subjective Data: Patient is seen and examined.  Patient is a 38 year old female with a past psychiatric history significant for cocaine dependence, alcohol dependence, and most likely opiate use disorder who presented to the [facility] emergency department on 12/22/2018 after an intentional Tylenol overdose.  The patient stated she had plan to hang herself, but did not carry out the plan.  She stated that she had been living in the home of her boyfriend and his mother, but was unable to return to that home.  She stated that after her discharge from the hospital in 08/2018 she did not follow-up or go to any residential or outpatient substance abuse treatment program.  Her laboratories revealed an elevated Tylenol level at 55 in their emergency department.  Her drug screen was positive for cocaine.  She was transferred to our facility for continued treatment.  Continued Clinical Symptoms:  Alcohol Use Disorder Identification Test Final Score (AUDIT): 28 The "Alcohol Use Disorders Identification Test", Guidelines for Use in Primary Care, Second Edition.  World Science writer [facility]). Score between 0-7:  no or low risk or alcohol related problems. Score between 8-15:  moderate risk of alcohol related problems. Score between 16-19:  high risk of alcohol related problems. Score 20 or above:  warrants further diagnostic evaluation for alcohol dependence and  treatment.   CLINICAL FACTORS:   Depression:   Anhedonia Comorbid alcohol abuse/dependence Hopelessness Impulsivity Insomnia Alcohol/Substance Abuse/Dependencies   Musculoskeletal: Strength & Muscle Tone: within normal limits Gait & Station: normal Patient leans: N/A  Psychiatric Specialty Exam: Physical Exam  Nursing note and vitals reviewed. Constitutional: She appears well-developed and well-nourished.  HENT:  Head: Normocephalic and atraumatic.  Respiratory: Effort normal.  Neurological: She is alert.    ROS  Blood pressure 106/74, pulse (!) 110, temperature 98 F (36.7 C), temperature source Oral, resp. rate 18, height 5\' 4"  (1.626 m), weight 54.4 kg, SpO2 99 %.Body mass index is 20.6 kg/m.  General Appearance: Disheveled  Eye Contact:  Fair  Speech:  Normal Rate  Volume:  Decreased  Mood:  Anxious, Depressed and Dysphoric  Affect:  Congruent  Thought Process:  Coherent and Descriptions of Associations: Circumstantial  Orientation:  Full (Time, Place, and Person)  Thought Content:  Logical  Suicidal Thoughts:  Yes.  without intent/plan  Homicidal Thoughts:  No  Memory:  Immediate;   Fair Recent;   Fair Remote;   Fair  Judgement:  Intact  Insight:  Lacking  Psychomotor Activity:  Increased  Concentration:  Concentration: Fair and Attention Span: Fair  Recall:  of Knowledge:  Fair  Language:  Good  Akathisia:  Negative  Handed:  Right  AIMS (if indicated):     Assets:  Desire for Improvement Resilience  ADL's:  Intact  Cognition:  WNL  Sleep:  Number of Hours: 6.25      COGNITIVE FEATURES THAT CONTRIBUTE TO RISK:  None    SUICIDE RISK:   Mild:  Suicidal ideation of limited frequency, intensity, duration, and specificity.  There are no identifiable plans,  no associated intent, mild dysphoria and related symptoms, good self-control (both objective and subjective assessment), few other risk factors, and identifiable protective factors,  including available and accessible social support.  PLAN OF CARE: Patient is seen and examined.  Patient is a 38 year old female with the above-stated past psychiatric history who is admitted secondary to polysubstance dependence as well as suicidal ideation.  She will be admitted to the hospital.  She will be integrated into the milieu.  She will be encouraged to attend groups.  She will be placed on lorazepam 1 mg p.o. every 6 hours as needed a CIWA greater than 10.  She will also receive thiamine and folic acid.  She will be restarted on her gabapentin which she received on her last admission.  She had been discharged on Strattera, and I see no sense in continuing that for now but seeing that she has continued to abuse substances.  She complains of pain, and will be given Flexeril 10 mg p.o. 3 times daily as needed muscle spasms as well as Advil.  Her Tylenol level was elevated at [facility], and I reveal repeat that this morning.  Liver function enzymes were normal.  I have referred her to social work to discuss the possibility of residential substance abuse program.  She is essentially homeless at least at this point.  I certify that inpatient services furnished can reasonably be expected to improve the patient's condition.   Sharma Covert, MD 12/24/2018, 8:26 AM

## 2018-12-24 NOTE — Progress Notes (Signed)
   12/24/18 0015  Psych Admission Type (Psych Patients Only)  Admission Status Voluntary  Psychosocial Assessment  Patient Complaints Anxiety;Substance abuse;Depression  Eye Contact Fair  Facial Expression Flat  Affect Appropriate to circumstance  Speech Logical/coherent  Interaction Assertive  Appearance/Hygiene Unremarkable  Behavior Characteristics Cooperative;Appropriate to situation  Mood Anxious  Thought Process  Coherency WDL  Content WDL  Delusions WDL  Perception WDL  Hallucination None reported or observed  Judgment WDL  Confusion WDL  Danger to Self  Current suicidal ideation? Denies  Danger to Others  Danger to Others None reported or observed  Patient sleeping off and on. CIWA=7 but blood pressure and pulse elevated tonight. Ativan given prn.

## 2018-12-24 NOTE — Progress Notes (Signed)
[place] NOVEL CORONAVIRUS (COVID-19) DAILY CHECK-OFF SYMPTOMS - answer yes or no to each - every day NO YES  Have you had a fever in the past 24 hours?  . Fever (Temp > 37.80C / 100F) X   Have you had any of these symptoms in the past 24 hours? . New Cough .  Sore Throat  .  Shortness of Breath .  Difficulty Breathing .  Unexplained Body Aches   X   Have you had any one of these symptoms in the past 24 hours not related to allergies?   . Runny Nose .  Nasal Congestion .  Sneezing   X   If you have had runny nose, nasal congestion, sneezing in the past 24 hours, has it worsened?  X   EXPOSURES - check yes or no X   Have you traveled outside the state in the past 14 days?  X   Have you been in contact with someone with a confirmed diagnosis of COVID-19 or PUI in the past 14 days without wearing appropriate PPE?  X   Have you been living in the same home as a person with confirmed diagnosis of COVID-19 or a PUI (household contact)?    X   Have you been diagnosed with COVID-19?    X              What to do next: Answered NO to all: Answered YES to anything:   Proceed with unit schedule Follow the [facility] Inpatient Flowsheet.   

## 2018-12-25 DIAGNOSIS — F332 Major depressive disorder, recurrent severe without psychotic features: Secondary | ICD-10-CM

## 2018-12-25 MED ORDER — CITALOPRAM HYDROBROMIDE 20 MG PO TABS
20.0000 mg | ORAL_TABLET | Freq: Every day | ORAL | Status: DC
  Administered 2018-12-25 – 2018-12-29 (×5): 20 mg via ORAL
  Filled 2018-12-25 (×7): qty 1

## 2018-12-25 MED ORDER — RISPERIDONE 1 MG PO TABS
1.0000 mg | ORAL_TABLET | Freq: Every day | ORAL | Status: DC
  Administered 2018-12-25 – 2018-12-28 (×4): 1 mg via ORAL
  Filled 2018-12-25 (×7): qty 1

## 2018-12-25 NOTE — BHH Counselor (Signed)
CSW called and provided [facility] with the authorization number for the patient as provided by [employer].  CSW asked of status of referral and was informed that the physician was currently reviewing the referral.  Assunta Curtis, MSW, LCSW 12/25/2018 11:09 AM

## 2018-12-25 NOTE — Plan of Care (Signed)
  Problem: Coping: Goal: Ability to verbalize frustrations and anger appropriately will improve Outcome: Progressing Goal: Ability to demonstrate self-control will improve Outcome: Progressing   Problem: Safety: Goal: Periods of time without injury will increase Outcome: Progressing   Problem: Safety: Goal: Ability to remain free from injury will improve Outcome: Progressing

## 2018-12-25 NOTE — BHH Suicide Risk Assessment (Signed)
[place] INPATIENT:  Family/Significant Other Suicide Prevention Education  Suicide Prevention Education:  Contact Attempts: with Carita Pian, friend [phone]) has been identified by the patient as the family member/significant other with whom the patient will be residing, and identified as the person(s) who will aid the patient in the event of a mental health crisis.  With written consent from the patient, two attempts were made to provide suicide prevention education, prior to and/or following the patient's discharge.  We were unsuccessful in providing suicide prevention education.  A suicide education pamphlet was given to the patient to share with family/significant other.  Date and time of first attempt:12/25/2018 / 11:08am Date and time of second attempt: 12/25/2018 at 1:45pm. Matthew's father answered the phone and took down a message with callback information.    Joellen Jersey 12/25/2018, 1:48 PM

## 2018-12-25 NOTE — Progress Notes (Signed)
   12/25/18 0110  Psych Admission Type (Psych Patients Only)  Admission Status Voluntary  Psychosocial Assessment  Patient Complaints Anxiety;Restlessness  Eye Contact Fair  Facial Expression Flat  Affect Appropriate to circumstance  Speech Logical/coherent  Interaction Assertive  Motor Activity Tremors  Appearance/Hygiene Unremarkable  Behavior Characteristics Cooperative  Mood Anxious  Thought Process  Coherency WDL  Content WDL  Delusions None reported or observed  Perception WDL  Hallucination None reported or observed  Judgment Impaired  Confusion None  Danger to Self  Current suicidal ideation? Denies  Danger to Others  Danger to Others None reported or observed  D: Patient in dayroom appears calm and cooperative.  A: Medications administered as prescribed. Support and encouragement provided as needed.  R: Patient remains safe on the unit. Will continue to monitor for safety and stability.

## 2018-12-25 NOTE — Progress Notes (Addendum)
Patient ID: Shanaya A Sarate, female   DOB: 04/19/1980, 38 y.o.   MRN: [id number]    [place] NOVEL CORONAVIRUS (COVID-19) DAILY CHECK-OFF SYMPTOMS - answer yes or no to each - every day NO YES  Have you had a fever in the past 24 hours?  . Fever (Temp > 37.80C / 100F) X   Have you had any of these symptoms in the past 24 hours? . New Cough .  Sore Throat  .  Shortness of Breath .  Difficulty Breathing .  Unexplained Body Aches   X   Have you had any one of these symptoms in the past 24 hours not related to allergies?   . Runny Nose .  Nasal Congestion .  Sneezing   X   If you have had runny nose, nasal congestion, sneezing in the past 24 hours, has it worsened?  X   EXPOSURES - check yes or no X   Have you traveled outside the state in the past 14 days?  X   Have you been in contact with someone with a confirmed diagnosis of COVID-19 or PUI in the past 14 days without wearing appropriate PPE?  X   Have you been living in the same home as a person with confirmed diagnosis of COVID-19 or a PUI (household contact)?    X   Have you been diagnosed with COVID-19?    X              What to do next: Answered NO to all: Answered YES to anything:   Proceed with unit schedule Follow the [facility] Inpatient Flowsheet.   

## 2018-12-25 NOTE — Progress Notes (Signed)
Recreation Therapy Notes  Date: 12.4.20 Time: 0930 Location: 300 Hall Dayroom  Group Topic: Stress Management  Goal Area(s) Addresses:  Patient will identify positive stress management techniques. Patient will identify benefits of using stress management post d/c.  Behavioral Response: Engaged  Intervention: Stress Management  Activity :  Meditation.  LRT played a meditation that focused on making the most of your day.  Patients were to listen and follow along with the meditation as it played to engage in the meditation.  Education:  Stress Management, Discharge Planning.   Education Outcome: Acknowledges Education  Clinical Observations/Feedback: Pt attended and participated in activity.     Victorino Sparrow, LRT/CTRS     Ria Comment, Valinda Fedie A 12/25/2018 11:10 AM

## 2018-12-25 NOTE — [facility] Group Notes (Signed)
12/25/2018 8:45am Type of Group and Topic: Psychoeducational Group: Discharge Planning  Participation Level: Did Not Attend  Description of Group Discharge planning group reviews patient's anticipated discharge plans and assists patients to anticipate and address any barriers to wellness/recovery in the community. Suicide prevention education is reviewed with patients in group. Therapeutic Goals 1. Patients will state their anticipated discharge plan and mental health aftercare 2. Patients will identify potential barriers to wellness in the community setting 3. Patients will engage in problem solving, solution focused discussion of ways to anticipate and address barriers to wellness/recovery   Summary of Patient Progress Plan for Discharge/Comments:  Invited, chose not to attend.    Therapeutic Modalities: Motivational Interviewing     Radonna Ricker, MSW, [place] Worker [facility]  Phone: [phone] 12/25/2018 3:07 PM

## 2018-12-25 NOTE — Tx Team (Signed)
Interdisciplinary Treatment and Diagnostic Plan Update  12/25/2018 Time of Session:  April [name] MRN: [id number]  Principal Diagnosis: <principal problem not specified>  Secondary Diagnoses: Active Problems:   MDD (major depressive disorder)   Current Medications:  Current Facility-Administered Medications  Medication Dose Route Frequency Provider Last Rate Last Dose  . albuterol (VENTOLIN HFA) 108 (90 Base) MCG/ACT inhaler 2 puff  2 puff Inhalation Q6H PRN Sharma Covert, MD      . cyclobenzaprine (FLEXERIL) tablet 10 mg  10 mg Oral TID PRN Sharma Covert, MD   10 mg at 12/24/18 0946  . feeding supplement (ENSURE ENLIVE) (ENSURE ENLIVE) liquid 237 mL  237 mL Oral BID BM Sharma Covert, MD   237 mL at 12/24/18 1130  . gabapentin (NEURONTIN) capsule 300 mg  300 mg Oral TID Sharma Covert, MD   300 mg at 12/25/18 0800  . hydrOXYzine (ATARAX/VISTARIL) tablet 25 mg  25 mg Oral Q6H PRN Sharma Covert, MD   25 mg at 12/25/18 0800  . ibuprofen (ADVIL) tablet 400 mg  400 mg Oral Q6H PRN Sharma Covert, MD   400 mg at 12/24/18 1508  . lidocaine (LIDODERM) 5 % 1 patch  1 patch Transdermal Q24H Sharma Covert, MD   1 patch at 12/25/18 0820  . LORazepam (ATIVAN) tablet 1 mg  1 mg Oral Q6H PRN Sharma Covert, MD   1 mg at 12/24/18 2133  . nicotine (NICODERM CQ - dosed in mg/24 hours) patch 21 mg  21 mg Transdermal Daily Sharma Covert, MD   21 mg at 12/25/18 0800  . prenatal multivitamin tablet 1 tablet  1 tablet Oral Q1200 Sharma Covert, MD   1 tablet at 12/24/18 1207  . traZODone (DESYREL) tablet 50 mg  50 mg Oral QHS PRN Sharma Covert, MD   50 mg at 12/24/18 2133   PTA Medications: Medications Prior to Admission  Medication Sig Dispense Refill Last Dose  . albuterol (PROVENTIL HFA;VENTOLIN HFA) 108 (90 BASE) MCG/ACT inhaler Inhale 2 puffs into the lungs every 6 (six) hours as needed. For bronchitis      . atomoxetine (STRATTERA) 10 MG capsule Take  1 capsule (10 mg total) by mouth daily. (Patient not taking: Reported on 12/22/2018) 30 capsule 0   . gabapentin (NEURONTIN) 300 MG capsule Take 1 capsule (300 mg total) by mouth 3 (three) times daily. 30 capsule 0   . hydrOXYzine (ATARAX/VISTARIL) 25 MG tablet Take 1 tablet (25 mg total) by mouth every 6 (six) hours as needed for anxiety. 30 tablet 0   . nicotine polacrilex (NICORETTE) 2 MG gum Take 1 each (2 mg total) by mouth as needed for smoking cessation. (Patient not taking: Reported on 12/22/2018) 100 tablet 0   . Prenatal Vit-Fe Fumarate-FA (PRENATAL MULTIVITAMIN) TABS tablet Take 1 tablet by mouth daily at 12 noon. (Patient not taking: Reported on 12/22/2018) 30 tablet 0   . traZODone (DESYREL) 50 MG tablet Take 1 tablet (50 mg total) by mouth at bedtime as needed for sleep. 30 tablet 0     Patient Stressors: Loss of fetus from miscarriage in October 2020 Substance abuse  Patient Strengths: Ability for insight Agricultural engineer for treatment/growth Supportive family/friends  Treatment Modalities: Medication Management, Group therapy, Case management,  1 to 1 session with clinician, Psychoeducation, Recreational therapy.   Physician Treatment Plan for Primary Diagnosis: <principal problem not specified> Long Term Goal(s): Improvement in symptoms so as ready for  discharge Improvement in symptoms so as ready for discharge   Short Term Goals: Ability to identify changes in lifestyle to reduce recurrence of condition will improve Ability to verbalize feelings will improve Ability to disclose and discuss suicidal ideas Ability to demonstrate self-control will improve Ability to identify and develop effective coping behaviors will improve Ability to maintain clinical measurements within normal limits will improve Compliance with prescribed medications will improve Ability to identify triggers associated with substance abuse/mental health issues will improve Ability to  identify changes in lifestyle to reduce recurrence of condition will improve Ability to verbalize feelings will improve Ability to disclose and discuss suicidal ideas Ability to demonstrate self-control will improve Ability to identify and develop effective coping behaviors will improve Ability to maintain clinical measurements within normal limits will improve Compliance with prescribed medications will improve Ability to identify triggers associated with substance abuse/mental health issues will improve  Medication Management: Evaluate patient's response, side effects, and tolerance of medication regimen.  Therapeutic Interventions: 1 to 1 sessions, Unit Group sessions and Medication administration.  Evaluation of Outcomes: Not Met  Physician Treatment Plan for Secondary Diagnosis: Active Problems:   MDD (major depressive disorder)  Long Term Goal(s): Improvement in symptoms so as ready for discharge Improvement in symptoms so as ready for discharge   Short Term Goals: Ability to identify changes in lifestyle to reduce recurrence of condition will improve Ability to verbalize feelings will improve Ability to disclose and discuss suicidal ideas Ability to demonstrate self-control will improve Ability to identify and develop effective coping behaviors will improve Ability to maintain clinical measurements within normal limits will improve Compliance with prescribed medications will improve Ability to identify triggers associated with substance abuse/mental health issues will improve Ability to identify changes in lifestyle to reduce recurrence of condition will improve Ability to verbalize feelings will improve Ability to disclose and discuss suicidal ideas Ability to demonstrate self-control will improve Ability to identify and develop effective coping behaviors will improve Ability to maintain clinical measurements within normal limits will improve Compliance with prescribed  medications will improve Ability to identify triggers associated with substance abuse/mental health issues will improve     Medication Management: Evaluate patient's response, side effects, and tolerance of medication regimen.  Therapeutic Interventions: 1 to 1 sessions, Unit Group sessions and Medication administration.  Evaluation of Outcomes: Not Met   RN Treatment Plan for Primary Diagnosis: <principal problem not specified> Long Term Goal(s): Knowledge of disease and therapeutic regimen to maintain health will improve  Short Term Goals: Ability to participate in decision making will improve, Ability to verbalize feelings will improve, Ability to disclose and discuss suicidal ideas, Ability to identify and develop effective coping behaviors will improve and Compliance with prescribed medications will improve  Medication Management: RN will administer medications as ordered by provider, will assess and evaluate patient's response and provide education to patient for prescribed medication. RN will report any adverse and/or side effects to prescribing provider.  Therapeutic Interventions: 1 on 1 counseling sessions, Psychoeducation, Medication administration, Evaluate responses to treatment, Monitor vital signs and CBGs as ordered, Perform/monitor CIWA, COWS, AIMS and Fall Risk screenings as ordered, Perform wound care treatments as ordered.  Evaluation of Outcomes: Not Met   LCSW Treatment Plan for Primary Diagnosis: <principal problem not specified> Long Term Goal(s): Safe transition to appropriate next level of care at discharge, Engage patient in therapeutic group addressing interpersonal concerns.  Short Term Goals: Engage patient in aftercare planning with referrals and resources  Therapeutic Interventions:  Assess for all discharge needs, 1 to 1 time with Social worker, Explore available resources and support systems, Assess for adequacy in community support network, Educate family  and significant other(s) on suicide prevention, Complete Psychosocial Assessment, Interpersonal group therapy.  Evaluation of Outcomes: Not Met   Progress in Treatment: Attending groups: Yes. Participating in groups: Yes. Taking medication as prescribed: Yes. Toleration medication: Yes. Family/Significant other contact made: No, will contact:  the patient's friend Patient understands diagnosis: Yes. Discussing patient identified problems/goals with staff: Yes. Medical problems stabilized or resolved: Yes. Denies suicidal/homicidal ideation: Yes. Issues/concerns per patient self-inventory: No. Other:   New problem(s) identified: None   New Short Term/Long Term Goal(s): Detox, medication stabilization, elimination of SI thoughts, development of comprehensive mental wellness plan.    Patient Goals:    Discharge Plan or Barriers: Patient plans to discharge to [facility] for residential treatment, if a bed is available. The patient reports if she does not find placement, she plans to stay with a friend in [place] and will follow up with Tamela Gammon for outpatient medication management and therapy services. CSW will continue to follow.   Reason for Continuation of Hospitalization: Aggression Anxiety Depression Medication stabilization  Estimated Length of Stay: 3-5 days   Attendees: Patient: 12/25/2018 9:21 AM  Physician: Dr. Myles Lipps, MD 12/25/2018 9:21 AM  Nursing: Elberta Fortis.A, RN 12/25/2018 9:21 AM  RN Care Manager: 12/25/2018 9:21 AM  Social Worker: Radonna Ricker, LCSW 12/25/2018 9:21 AM  Recreational Therapist:  12/25/2018 9:21 AM  Other:  12/25/2018 9:21 AM  Other:  12/25/2018 9:21 AM  Other: 12/25/2018 9:21 AM    Scribe for Treatment Team: Marylee Floras, [place] 12/25/2018 9:21 AM

## 2018-12-25 NOTE — B[facility] Suicide Risk Assessment (Signed)
[place] INPATIENT:  Family/Significant Other Suicide Prevention Education  Suicide Prevention Education:  Contact Attempts: with Carita Pian, friend [phone]) has been identified by the patient as the family member/significant other with whom the patient will be residing, and identified as the person(s) who will aid the patient in the event of a mental health crisis.  With written consent from the patient, two attempts were made to provide suicide prevention education, prior to and/or following the patient's discharge.  We were unsuccessful in providing suicide prevention education.  A suicide education pamphlet was given to the patient to share with family/significant other.  Date and time of first attempt:12/25/2018 / 11:08am   Marylee Floras 12/25/2018, 11:08 AM

## 2018-12-25 NOTE — Progress Notes (Signed)
Psychoeducational Group Note  Date:  12/25/2018 Time: 2030  Group Topic/Focus:  wrap up group  Participation Level: Did Not Attend  Participation Quality:  Not Applicable  Affect:  Not Applicable  Cognitive:  Not Applicable  Insight:  Not Applicable  Engagement in Group: Not Applicable  Additional Comments:  Pt was asleep during group time.   Shellia Cleverly 12/25/2018, 10:13 PM

## 2018-12-25 NOTE — Progress Notes (Signed)
[facility] MD Progress Note  12/25/2018 12:29 PM April [name]  MRN:  [id number]  Subjective: April [name] reports, "I'm feeling very depressed today even so before I came to the hospital. I was thinking about ending it all. I took bunch of sleeping pills, went into the woods to hang myself, but my best friend talked out of it. My life is a mess. I just had a miscarriage last October, 2020. My mother has nothing to do with me because of my drug use & drug addicted boyfriend. I have a 11 year old son that I have not seen in over 13 months. I feel horrible. I need something for depression. My mind id is constantly going & going".  Objective: Patient is a 38 year old female with a past psychiatric history significant for cocaine dependence, alcohol dependence, and most likely opiate use disorder who presented to the [facility] emergency department on 12/22/2018 after an intentional Tylenol overdose. The patient stated she had plan to hang herself, but did not carry out the plan. She stated that she had been living in the home of her boyfriend and his mother, but was unable to return to that home. She stated that after her discharge from the hospital in 08/2018 she did not follow-up or go to any residential or outpatient substance abuse treatment program. .  April [name] is seen, chart reviewed. The chart findings discussed with the treatment team. She presents alert, oriented & aware of situation. She is visible on the unit, attending group sessions. She presents today tearful, emotional, complaining of worsening depression. She says her mind is constantly going & unable to shut off. She says she feels that her life is not going in the right direction. She says her mother has nothing to do with her because of her drug use & her drug addicted boyfriend. She says she is saddened by the fact that she has a 6 year old son that she has not seen in 13 months. She says she wants to stop using drugs, but this has proven a challenge for  her. She is hoping to get into some long term substance abuse program after discharge. She is requesting something for anxiety. And because she is also complaining of racing thoughts, will initiate a trial of Risperdal 1 mg po Q hs. She currently denies any SIHI, AVH, delusional thoughts or paranoia. She does not appear to be responding to any internal stimuli.  Principal Problem: Opiate dependence/alcohol abuse and intoxication/cocaine abuse/depression recurrent severe  Diagnosis: Principal Problem:   MDD (major depressive disorder)  Total Time spent with patient: 20 minutes  Past Psychiatric History: Recurrent severe depression without psychosis/history of chronic chemical dependency struggle  Past Medical History:  Past Medical History:  Diagnosis Date  . Anxiety   . Bronchitis   . Depression   . Miscarriage     Past Surgical History:  Procedure Laterality Date  . ANKLE SURGERY     left   Family History:  Family History  Problem Relation Age of Onset  . Hypertension Mother   . Diabetes Other   . Cancer Other   . Hypertension Other    Family Psychiatric  History: See H&P  Social History:  Social History   Substance and Sexual Activity  Alcohol Use Yes  . Alcohol/week: 11.0 standard drinks  . Types: 11 Cans of beer per week   Comment: daily     Social History   Substance and Sexual Activity  Drug Use Yes  . Types:  Cocaine   Comment: last use 12/21/18    Social History   Socioeconomic History  . Marital status: Single    Spouse name: Not on file  . Number of children: Not on file  . Years of education: Not on file  . Highest education level: Not on file  Occupational History  . Not on file  Social Needs  . Financial resource strain: Not on file  . Food insecurity    Worry: Not on file    Inability: Not on file  . Transportation needs    Medical: Not on file    Non-medical: Not on file  Tobacco Use  . Smoking status: Current Every Day Smoker     Packs/day: 0.50    Years: 12.00    Pack years: 6.00    Types: Cigarettes  . Smokeless tobacco: Never Used  Substance and Sexual Activity  . Alcohol use: Yes    Alcohol/week: 11.0 standard drinks    Types: 11 Cans of beer per week    Comment: daily  . Drug use: Yes    Types: Cocaine    Comment: last use 12/21/18  . Sexual activity: Not Currently  Lifestyle  . Physical activity    Days per week: Not on file    Minutes per session: Not on file  . Stress: Not on file  Relationships  . Social Musician on phone: Not on file    Gets together: Not on file    Attends religious service: Not on file    Active member of club or organization: Not on file    Attends meetings of clubs or organizations: Not on file    Relationship status: Not on file  Other Topics Concern  . Not on file  Social History Narrative  . Not on file   Additional Social History:   Sleep: Good  Appetite:  Good  Current Medications: Current Facility-Administered Medications  Medication Dose Route Frequency Provider Last Rate Last Dose  . albuterol (VENTOLIN HFA) 108 (90 Base) MCG/ACT inhaler 2 puff  2 puff Inhalation Q6H PRN Antonieta Pert, MD      . citalopram (CELEXA) tablet 20 mg  20 mg Oral Daily Seanne Chirico I, NP      . cyclobenzaprine (FLEXERIL) tablet 10 mg  10 mg Oral TID PRN Antonieta Pert, MD   10 mg at 12/25/18 1017  . feeding supplement (ENSURE ENLIVE) (ENSURE ENLIVE) liquid 237 mL  237 mL Oral BID BM Antonieta Pert, MD   237 mL at 12/25/18 1014  . gabapentin (NEURONTIN) capsule 300 mg  300 mg Oral TID Antonieta Pert, MD   300 mg at 12/25/18 1155  . hydrOXYzine (ATARAX/VISTARIL) tablet 25 mg  25 mg Oral Q6H PRN Antonieta Pert, MD   25 mg at 12/25/18 0800  . ibuprofen (ADVIL) tablet 400 mg  400 mg Oral Q6H PRN Antonieta Pert, MD   400 mg at 12/25/18 1017  . lidocaine (LIDODERM) 5 % 1 patch  1 patch Transdermal Q24H Antonieta Pert, MD   1 patch at 12/25/18  0820  . LORazepam (ATIVAN) tablet 1 mg  1 mg Oral Q6H PRN Antonieta Pert, MD   1 mg at 12/24/18 2133  . nicotine (NICODERM CQ - dosed in mg/24 hours) patch 21 mg  21 mg Transdermal Daily Antonieta Pert, MD   21 mg at 12/25/18 0800  . prenatal multivitamin tablet 1 tablet  1 tablet  Oral Q1200 Antonieta Pert, MD   1 tablet at 12/24/18 1207  . risperiDONE (RISPERDAL) tablet 1 mg  1 mg Oral QHS Zakyria Metzinger I, NP      . traZODone (DESYREL) tablet 50 mg  50 mg Oral QHS PRN Antonieta Pert, MD   50 mg at 12/24/18 2133   Lab Results:  Results for orders placed or performed during the hospital encounter of 12/23/18 (from the past 48 hour(s))  Hemoglobin A1c     Status: None   Collection Time: 12/24/18  6:32 AM  Result Value Ref Range   Hgb A1c MFr Bld 5.0 4.8 - 5.6 %    Comment: (NOTE) Pre diabetes:          5.7%-6.4% Diabetes:              >6.4% Glycemic control for   <7.0% adults with diabetes    Mean Plasma Glucose 96.8 mg/dL    Comment: Performed at [facility], [address], [place], [place] [address]  Lipid panel     Status: None   Collection Time: 12/24/18  6:32 AM  Result Value Ref Range   Cholesterol 155 0 - 200 mg/dL   Triglycerides 808 <811 mg/dL   HDL 56 >03 mg/dL   Total CHOL/HDL Ratio 2.8 RATIO   VLDL 20 0 - 40 mg/dL   LDL Cholesterol 79 0 - 99 mg/dL    Comment:        Total Cholesterol/HDL:CHD Risk Coronary Heart Disease Risk Table                     Men   Women  1/2 Average Risk   3.4   3.3  Average Risk       5.0   4.4  2 X Average Risk   9.6   7.1  3 X Average Risk  23.4   11.0        Use the calculated Patient Ratio above and the CHD Risk Table to determine the patient's CHD Risk.        ATP III CLASSIFICATION (LDL):  <100     mg/dL   Optimal  159-458  mg/dL   Near or Above                    Optimal  130-159  mg/dL   Borderline  592-924  mg/dL   High  >462     mg/dL   Very High Performed at [facility], [address], [place], [place] [address]   TSH     Status: None   Collection Time: 12/24/18  6:32 AM  Result Value Ref Range   TSH 1.601 0.350 - 4.500 uIU/mL    Comment: Performed by a 3rd Generation assay with a functional sensitivity of <=0.01 uIU/mL. Performed at [facility], [address] [place], [place] [address]   Acetaminophen level     Status: Abnormal   Collection Time: 12/24/18  5:58 PM  Result Value Ref Range   Acetaminophen (Tylenol), Serum <10 (L) 10 - 30 ug/mL    Comment: (NOTE) Therapeutic concentrations vary significantly. A range of 10-30 ug/mL  may be an effective concentration for many patients. However, some  are best treated at concentrations outside of this range. Acetaminophen concentrations >150 ug/mL at 4 hours after ingestion  and >50 ug/mL at 12 hours after ingestion are often associated with  toxic reactions. Performed at  [facility], [place] [address] [place], [place] [address]     Blood Alcohol level:  Lab Results  Component Value Date   ETH 87 (H) 12/22/2018   ETH 201 (H) 19/41/7408   Metabolic Disorder Labs: Lab Results  Component Value Date   HGBA1C 5.0 12/24/2018   MPG 96.8 12/24/2018   MPG 99.67 09/03/2018   No results found for: PROLACTIN Lab Results  Component Value Date   CHOL 155 12/24/2018   TRIG 102 12/24/2018   HDL 56 12/24/2018   CHOLHDL 2.8 12/24/2018   VLDL 20 12/24/2018   LDLCALC 79 12/24/2018   Physical Findings: AIMS: Facial and Oral Movements Muscles of Facial Expression: None, normal Lips and Perioral Area: None, normal Jaw: None, normal Tongue: None, normal,Extremity Movements Upper (arms, wrists, hands, fingers): None, normal Lower (legs, knees, ankles, toes): None, normal, Trunk Movements Neck, shoulders, hips: None, normal, Overall Severity Severity of abnormal movements (highest score from questions above): None, normal Incapacitation due to abnormal movements: None,  normal Patient's awareness of abnormal movements (rate only patient's report): No Awareness, Dental Status Current problems with teeth and/or dentures?: No Does patient usually wear dentures?: No  CIWA:  CIWA-Ar Total: 1 COWS:  COWS Total Score: 2  Musculoskeletal: Strength & Muscle Tone: within normal limits Gait & Station: normal Patient leans: N/A  Psychiatric Specialty Exam: Physical Exam  Nursing note and vitals reviewed. Constitutional: She appears well-developed.  Psychiatric: She has a normal mood and affect. Her behavior is normal.    Review of Systems  Psychiatric/Behavioral: Positive for depression and substance abuse. The patient is nervous/anxious.   All other systems reviewed and are negative.   Blood pressure 116/78, pulse 92, temperature 98 F (36.7 C), temperature source Oral, resp. rate 18, height 5\' 4"  (1.626 m), weight 54.4 kg, SpO2 99 %.Body mass index is 20.6 kg/m.  General Appearance: Disheveled  Eye Contact:  Fair  Speech:  Normal Rate  Volume:  Normal  Mood: Anxious, depressed, tearful  Affect:  Congruent  Thought Process:  Coherent and Descriptions of Associations: Circumstantial  Orientation:  Full (Time, Place, and Person)  Thought Content: Logical, denies any hallucinations, delusions or paranoia.  Suicidal Thoughts:  No  Homicidal Thoughts:  No  Memory:  Immediate;   Fair Recent;   Fair Remote;   Fair  Judgement:  Intact  Insight:  Fair  Psychomotor Activity:  Normal  Concentration:  Concentration: Fair and Attention Span: Fair  Recall:  [employer] of Knowledge:  Fair  Language:  Good  Akathisia:  Negative  Handed:  Right  AIMS (if indicated):     Assets:  Desire for Improvement Resilience  ADL's:  Intact  Cognition:  WNL    Sleep:  Number of Hours: 6.75   Treatment Plan Summary: Daily contact with patient to assess and evaluate symptoms and progress in treatment and Medication management   -Continue inpatient  hospitalization.  -Will continue today 12/25/2018 plan as below except where it is noted.  -Mood control            -Initiated Risperdal 1 mg po Q hs.  -Depression.            -Initiated Citalopram 20 mg po daily.   -Anxiety  -Continue atarax 25 mg po q6h prn anxiety.             -Continue Lorazepam 1 mg po Q 6 hours prn for CIWA > 10.  -Insomnia  -Continue Trazodone 50 mg po  prn q hs.  -Agitation/pain              -Continue Flexeril 10 mg po tid prn for muscle spasms.              -Continue Gabapentin 300 mg po tid.              -Continue Lidocaine patch 5% topically Q 24 hours for pain   -Continue Albuterol inhaler 2 puffs Q 6 hrs prn for SOB  -Encourage participation in groups and therapeutic milieu  -Disposition planning will be ongoing   Armandina StammerAgnes Yanni Quiroa, NP, PMHNP, FNP-BC 12/25/2018, 12:29 PM Patient ID: Sunday SpillersLaura A Besser, female   DOB: 04/19/1980, 38 y.o.   MRN: [id number]

## 2018-12-25 NOTE — Progress Notes (Addendum)
Patient has been accepted to [facility] for residential treatment on Tuesday, 12/29/2018 at 11:30am.   Please have the patient ready for discharge by 10:00am. Patient will need to bring a 14-day supply of medications, discharge paperwork and clothing.     Dr. Myles Lipps, MD notified.    Radonna Ricker, MSW, LCSW Clinical Social Worker [facility]  Phone: [phone]

## 2018-12-25 NOTE — Progress Notes (Signed)
Adult Psychoeducational Group Note  Date:  12/25/2018 Time:  10:52 AM  Group Topic/Focus:  Recovery Goals:   The focus of this group is to identify appropriate goals for recovery and establish a plan to achieve them.  Participation Level:  Active  Participation Quality:  Appropriate  Affect:  Appropriate  Cognitive:  Alert  Insight: Appropriate  Engagement in Group:  Engaged  Modes of Intervention:  Discussion and Education  Additional Comments:    Pt participated in group with the MHT. Pt's received the daily packet. Today's topic is setbacks in recovery. MHT discussed readings and activities in the packet. Pt's were asked to determine their triggers and establish coping skills for those triggers.   April [name] 12/25/2018, 10:52 AM

## 2018-12-25 NOTE — BHH Suicide Risk Assessment (Signed)
[place] INPATIENT:  Family/Significant Other Suicide Prevention Education  Suicide Prevention Education:  Education Completed; Carita Pian, friend, [phone] has been identified by the patient as the family member/significant other with whom the patient will be residing, and identified as the person(s) who will aid the patient in the event of a mental health crisis (suicidal ideations/suicide attempt).  With written consent from the patient, the family member/significant other has been provided the following suicide prevention education, prior to the and/or following the discharge of the patient.  The suicide prevention education provided includes the following:  Suicide risk factors  Suicide prevention and interventions  National Suicide Hotline telephone number  [facility] assessment telephone number  [facility] Emergency Assistance [place] and/or Residential Mobile Crisis Unit telephone number  Request made of family/significant other to:  Remove weapons (e.g., guns, rifles, knives), all items previously/currently identified as safety concern.    Remove drugs/medications (over-the-counter, prescriptions, illicit drugs), all items previously/currently identified as a safety concern.  The family member/significant other verbalizes understanding of the suicide prevention education information provided.  The family member/significant other agrees to remove the items of safety concern listed above.  Patient's friend reports he has been worried about the patient for several weeks and attributes her suicidal thoughts and gestures to a recent toxic relationship and other stressors such as unstable housing.   Friend hopes that patient can continue on to long term residential treatment. CSW shared with friend that patient has been accepted to [facility] on 12/08 for residential substance use treatment. With this in mind, friend has no additional concerns regarding  treatment or a safe discharge plan.  Friend reports that he can pick up the patient from treatment at [facility], afterwards he and his father will support the patient emotionally.  No additional questions or concerns, friend provided contact information for CSW should concerns arise.  Joellen Jersey  12/25/2018, 3:05 PM

## 2018-12-26 NOTE — Progress Notes (Signed)
D.  Pt pleasant on approach, some complaint of chronic back pain.  Pt states motrin helping better than lidocaine patch.  Pt was positive for evening wrap up group, observed appropriately engaged with peers on the unit.  Pt denies SI/HI/AVH at this time.  A.  Support and encouragement offered, medication given as ordered  R. Pt remains safe on the unit, will continue to monitor.

## 2018-12-26 NOTE — Progress Notes (Signed)
   12/26/18 1000  Psych Admission Type (Psych Patients Only)  Admission Status Voluntary  Psychosocial Assessment  Patient Complaints Anxiety  Eye Contact Fair  Facial Expression Flat  Affect Anxious  Speech Logical/coherent  Interaction Assertive  Motor Activity Fidgety  Appearance/Hygiene Unremarkable  Behavior Characteristics Anxious  Mood Anxious  Thought Process  Coherency WDL  Content WDL  Delusions None reported or observed  Perception WDL  Hallucination None reported or observed  Judgment Impaired  Confusion None  Danger to Self  Current suicidal ideation? Denies  Danger to Others  Danger to Others None reported or observed

## 2018-12-26 NOTE — Progress Notes (Signed)
   12/26/18 0049  Psych Admission Type (Psych Patients Only)  Admission Status Voluntary  Psychosocial Assessment  Patient Complaints None  Eye Contact Fair  Facial Expression Flat  Affect Appropriate to circumstance  Speech Logical/coherent  Interaction Assertive  Motor Activity Other (Comment) (WNL)  Appearance/Hygiene Unremarkable  Behavior Characteristics Cooperative  Mood Anxious  Thought Process  Coherency WDL  Content WDL  Delusions None reported or observed  Perception WDL  Hallucination None reported or observed  Judgment Impaired  Confusion None  Danger to Self  Current suicidal ideation? Denies  Danger to Others  Danger to Others None reported or observed  D: Patient sleeping on approach. Pt appears calm and cooperative. Pt denies any needs. A: Medications administered as prescribed. Support and encouragement provided as needed.  R: Patient remains safe on the unit. Will continue to monitor for safety and stability

## 2018-12-26 NOTE — BHH Group Notes (Signed)
LCSW Adult Therapy Group Note  Date:  12/26/2018  Time:  10:00-11:00AM  Group Topic: Vignettes to Examine Healthy vs Unhealthy Coping Techniques  Participation Level:  Active  Description:    To determine what unhealthy coping techniques typically are used or and what healthy coping techniques would be helpful in coping with various problems. Patients were guided in becoming aware of the differences between healthy and unhealthy coping techniques.  Vignettes were used to generate ideas, which led to a discussion about personal application.     Therapeutic Goals 1. Patient will be able to tell whether a coping skill is healthy or unhealthy. 2. Patient will identify 1-2 healthy and 1-2 unhealthy coping skills they regularly use 3. Patient will discuss vignettes presented to determine unhealthy coping skills the hypothetical person might choose to use, and healthy coping skills that would be more helpful. 4. Patients will support each other.  Summary of Patient Progress:  The patient expressed current healthy coping skills employed include talking with her "real" friends and meditation, while current unhealthy coping skills often used are drinking an using drugs to forget.  Patient participated minimally out loud but she was observed to be attentive throughout the group.  Therapeutic Modalities Activity Psychoeducation Processing    Selmer Dominion, LCSW 12/26/2018, 1:24 PM

## 2018-12-26 NOTE — Progress Notes (Signed)
[facility] MD Progress Note  12/26/2018 1:05 PM April [name]  MRN:  [id number]  Subjective: April [name] reports, "I'm feeling feeling a little better today. I really did sleep good last night".  Objective: Patient is a 38 year old female with a past psychiatric history significant for cocaine dependence, alcohol dependence, and most likely opiate use disorder who presented to the [facility] emergency department on 12/22/2018 after an intentional Tylenol overdose. The patient stated she had plan to hang herself, but did not carry out the plan. She stated that she had been living in the home of her boyfriend and his mother, but was unable to return to that home. She stated that after her discharge from the hospital in 08/2018 she did not follow-up or go to any residential or outpatient substance abuse treatment program. .  April [name] is seen, chart reviewed. The chart findings discussed with the treatment team. She presents alert, oriented & aware of situation. She is visible on the unit, attending group sessions. She presents today with reports of feeling a lot better. She denies any racing thoughts today.  She currently denies any SIHI, AVH, delusional thoughts or paranoia. She does not appear to be responding to any internal stimuli. She is taking & tolerating her medication regimen as already in progress. April [name] reports that she is exited about going to the [facility] on Tuesday to continue treatment after discharge. She currently denies any SIHI, AVH, delusional thoughts or paranoia. She is in agreement to continue current plan of care as already in progress.  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 Anxiety   . Bronchitis   . Depression   . Miscarriage     Past Surgical History:  Procedure Laterality Date  . ANKLE SURGERY     left   Family History:  Family History  Problem Relation Age of Onset  . Hypertension Mother   . Diabetes Other   . Cancer Other   . Hypertension Other    Family Psychiatric  History: See H&P  Social History:  Social History   Substance and Sexual Activity  Alcohol Use Yes  . Alcohol/week: 11.0 standard drinks  . Types: 11 Cans of beer per week   Comment: daily     Social History   Substance and Sexual Activity  Drug Use Yes  . Types: Cocaine   Comment: last use 12/21/18    Social History   Socioeconomic History  . Marital status: Single    Spouse name: Not on file  . Number of children: Not on file  . Years of education: Not on file  . Highest education level: Not on file  Occupational History  . Not on file  Social Needs  . Financial resource strain: Not on file  . Food insecurity    Worry: Not on file    Inability: Not on file  . Transportation needs    Medical: Not on file    Non-medical: Not on file  Tobacco Use  . Smoking status: Current Every Day Smoker    Packs/day: 0.50    Years: 12.00    Pack years: 6.00    Types: Cigarettes  . Smokeless tobacco: Never Used  Substance and Sexual Activity  .  Alcohol use: Yes    Alcohol/week: 11.0 standard drinks    Types: 11 Cans of beer per week    Comment: daily  . Drug use: Yes    Types: Cocaine    Comment: last use 12/21/18  . Sexual activity: Not Currently  Lifestyle  . Physical activity    Days per week: Not on file    Minutes per session: Not on file  . Stress: Not on file  Relationships  . Social Musicianconnections    Talks on phone: Not on file    Gets together: Not on file    Attends religious service: Not on file    Active member of club or organization: Not on file    Attends meetings of clubs or  organizations: Not on file    Relationship status: Not on file  Other Topics Concern  . Not on file  Social History Narrative  . Not on file   Additional Social History:   Sleep: Good  Appetite:  Good  Current Medications: Current Facility-Administered Medications  Medication Dose Route Frequency Provider Last Rate Last Dose  . albuterol (VENTOLIN HFA) 108 (90 Base) MCG/ACT inhaler 2 puff  2 puff Inhalation Q6H PRN Antonieta Pertlary, Greg Lawson, MD      . citalopram (CELEXA) tablet 20 mg  20 mg Oral Daily Armandina Stammer,  I, NP   20 mg at 12/26/18 0805  . cyclobenzaprine (FLEXERIL) tablet 10 mg  10 mg Oral TID PRN Antonieta Pertlary, Greg Lawson, MD   10 mg at 12/25/18 1017  . feeding supplement (ENSURE ENLIVE) (ENSURE ENLIVE) liquid 237 mL  237 mL Oral BID BM Antonieta Pertlary, Greg Lawson, MD   237 mL at 12/26/18 1005  . gabapentin (NEURONTIN) capsule 300 mg  300 mg Oral TID Antonieta Pertlary, Greg Lawson, MD   300 mg at 12/26/18 1213  . hydrOXYzine (ATARAX/VISTARIL) tablet 25 mg  25 mg Oral Q6H PRN Antonieta Pertlary, Greg Lawson, MD   25 mg at 12/26/18 0807  . ibuprofen (ADVIL) tablet 400 mg  400 mg Oral Q6H PRN Antonieta Pertlary, Greg Lawson, MD   400 mg at 12/26/18 1004  . lidocaine (LIDODERM) 5 % 1 patch  1 patch Transdermal Q24H Antonieta Pertlary, Greg Lawson, MD   1 patch at 12/26/18 0805  . LORazepam (ATIVAN) tablet 1 mg  1 mg Oral Q6H PRN Antonieta Pertlary, Greg Lawson, MD   1 mg at 12/25/18 1448  . nicotine (NICODERM CQ - dosed in mg/24 hours) patch 21 mg  21 mg Transdermal Daily Antonieta Pertlary, Greg Lawson, MD   21 mg at 12/26/18 0805  . prenatal multivitamin tablet 1 tablet  1 tablet Oral Q1200 Antonieta Pertlary, Greg Lawson, MD   1 tablet at 12/26/18 1213  . risperiDONE (RISPERDAL) tablet 1 mg  1 mg Oral QHS ,  I, NP   1 mg at 12/25/18 2207  . traZODone (DESYREL) tablet 50 mg  50 mg Oral QHS PRN Antonieta Pertlary, Greg Lawson, MD   50 mg at 12/26/18 0133   Lab Results:  Results for orders placed or performed during the hospital encounter of 12/23/18 (from the past 48 hour(s))  Acetaminophen  level     Status: Abnormal   Collection Time: 12/24/18  5:58 PM  Result Value Ref Range   Acetaminophen (Tylenol), Serum <10 (L) 10 - 30 ug/mL    Comment: (NOTE) Therapeutic concentrations vary significantly. A range of 10-30 ug/mL  may be an effective concentration for many patients. However, some  are best treated at concentrations outside of this range.  Acetaminophen concentrations >150 ug/mL at 4 hours after ingestion  and >50 ug/mL at 12 hours after ingestion are often associated with  toxic reactions. Performed at [facility], [address], [place], [place] [address]     Blood Alcohol level:  Lab Results  Component Value Date   ETH 87 (H) 12/22/2018   ETH 201 (H) 09/01/2018   Metabolic Disorder Labs: Lab Results  Component Value Date   HGBA1C 5.0 12/24/2018   MPG 96.8 12/24/2018   MPG 99.67 09/03/2018   No results found for: PROLACTIN Lab Results  Component Value Date   CHOL 155 12/24/2018   TRIG 102 12/24/2018   HDL 56 12/24/2018   CHOLHDL 2.8 12/24/2018   VLDL 20 12/24/2018   LDLCALC 79 12/24/2018   Physical Findings: AIMS: Facial and Oral Movements Muscles of Facial Expression: None, normal Lips and Perioral Area: None, normal Jaw: None, normal Tongue: None, normal,Extremity Movements Upper (arms, wrists, hands, fingers): None, normal Lower (legs, knees, ankles, toes): None, normal, Trunk Movements Neck, shoulders, hips: None, normal, Overall Severity Severity of abnormal movements (highest score from questions above): None, normal Incapacitation due to abnormal movements: None, normal Patient's awareness of abnormal movements (rate only patient's report): No Awareness, Dental Status Current problems with teeth and/or dentures?: No Does patient usually wear dentures?: No  CIWA:  CIWA-Ar Total: 1 COWS:  COWS Total Score: 2  Musculoskeletal: Strength & Muscle Tone: within normal limits Gait & Station: normal Patient leans:  N/A  Psychiatric Specialty Exam: Physical Exam  Nursing note and vitals reviewed. Constitutional: She appears well-developed.  Psychiatric: She has a normal mood and affect. Her behavior is normal.    Review of Systems  Psychiatric/Behavioral: Positive for depression and substance abuse. The patient is nervous/anxious.   All other systems reviewed and are negative.   Blood pressure (!) 136/93, pulse (!) 115, temperature 98 F (36.7 C), temperature source Oral, resp. rate 14, height 5\' 4"  (1.626 m), weight 54.4 kg, SpO2 99 %.Body mass index is 20.6 kg/m.  General Appearance: Disheveled  Eye Contact:  Fair  Speech:  Normal Rate  Volume:  Normal  Mood: Anxious, depressed, tearful  Affect:  Congruent  Thought Process:  Coherent and Descriptions of Associations: Circumstantial  Orientation:  Full (Time, Place, and Person)  Thought Content: Logical, denies any hallucinations, delusions or paranoia.  Suicidal Thoughts:  No  Homicidal Thoughts:  No  Memory:  Immediate;   Fair Recent;   Fair Remote;   Fair  Judgement:  Intact  Insight:  Fair  Psychomotor Activity:  Normal  Concentration:  Concentration: Fair and Attention Span: Fair  Recall:  of Knowledge:  Fair  Language:  Good  Akathisia:  Negative  Handed:  Right  AIMS (if indicated):     Assets:  Desire for Improvement Resilience  ADL's:  Intact  Cognition:  WNL    Sleep:  Number of Hours: 4.5   Treatment Plan Summary: Daily contact with patient to assess and evaluate symptoms and progress in treatment and Medication management   -Continue inpatient hospitalization.  -Will continue today 12/26/2018 plan as below except where it is noted.  -Mood control            -Continue Risperdal 1 mg po Q hs.  -Depression.            -Continue Citalopram 20 mg po daily.   -Anxiety  -Continue atarax 25 mg po q6h prn anxiety.             -  Continue Lorazepam 1 mg po Q 6 hours prn for CIWA >  10.  -Insomnia  -Continue Trazodone 50 mg po prn q hs.  -Agitation/pain              -Continue Flexeril 10 mg po tid prn for muscle spasms.              -Continue Gabapentin 300 mg po tid.              -Continue Lidocaine patch 5% topically Q 24 hours for pain  -Continue Albuterol inhaler 2 puffs Q 6 hrs prn for SOB  -Encourage participation in groups and therapeutic milieu  -Disposition planning will be ongoing  Armandina Stammer, NP, PMHNP, FNP-BC 12/26/2018, 1:05 PM Patient ID: April [name], female   DOB: 1980-02-23, 38 y.o.   MRN: [id number]

## 2018-12-27 NOTE — Progress Notes (Signed)
[facility] Group Notes:  (Nursing/MHT/Case Management/Adjunct)  Date:  12/27/2018  Time:  2030  Type of Therapy:  wrap up group  Participation Level:  Active  Participation Quality:  Appropriate, Attentive, Sharing and Supportive  Affect:  Appropriate  Cognitive:  Appropriate  Insight:  Improving  Engagement in Group:  Engaged  Modes of Intervention:  Clarification, Education and Support  Summary of Progress/Problems: Positive thinking and positive change were discussed.   Shellia Cleverly 12/27/2018, 9:59 PM

## 2018-12-27 NOTE — Progress Notes (Signed)
D:  Patient's self inventory sheet, patient has fair sleep, no sleep medication. Good appetite, low energy level, poor concentration.  Rated depression and anxiety 6, hopeless 7.  Withdrawals, chilling, cravings, cramping, agitation, irritability.  Denied SI.  Physical problems.  Physical pain, lower back, worst pain #6 in past 24 hours.  Pain medicine helpful.  Goal is get over hopeless.  Plans to work on herself.  Does have discharge plans. A:  Medications administered per MD orders.  Emotional support and encouragement given patient. R:  Denied SI and HI, contracts for safety.  Denied A/V hallucinations.  Safety maintained with 15 minute checks.

## 2018-12-27 NOTE — [facility] Group Notes (Signed)
[facility] LCSW Group Therapy Note  Date/Time:  12/27/2018 9:00-10:00 or 10:00-11:00AM  Type of Therapy and Topic:  Group Therapy:  Healthy and Unhealthy Supports  Participation Level:  Active   Description of Group:  Patients in this group were introduced to the idea of adding a variety of healthy supports to address the various needs in their lives.Patients discussed what additional healthy supports could be helpful in their recovery and wellness after discharge in order to prevent future hospitalizations.   An emphasis was placed on using counselor, doctor, therapy groups, 12-step groups, and problem-specific support groups to expand supports.  Several songs were played to emphasize points made throughout group.  Therapeutic Goals:   1)  discuss importance of adding supports to stay well once out of the hospital  2)  compare healthy versus unhealthy supports and identify some examples of each  3)  generate ideas and descriptions of healthy supports that can be added  4)  offer mutual support about how to address unhealthy supports  5)  encourage active participation in and adherence to discharge plan    Summary of Patient Progress:  The patient stated that current healthy support in her life is one specific friend who would not give up on her, put minutes on her phone and kept reaching out to her even while she was looking for a branch on which to kill herself.  Current unhealthy supports include her boyfriend who has been providing drugs and alcohol for free.  The patient expressed a willingness to add therapy as support(s) to help in her recovery journey.   Therapeutic Modalities:   Motivational Interviewing Brief Solution-Focused Therapy  Selmer Dominion, LCSW

## 2018-12-27 NOTE — Progress Notes (Signed)
[facility] MD Progress Note  12/27/2018 1:07 PM April [name]  MRN:  [id number]  Subjective: April [name] reports, "My mood is really good. I'm feeling better. I have not cried any, it has been 2 days now. I can't wait to continue my treatment at [facility] on Tuesday. I'm ready to make the move to a better me".  Objective: Patient is a 38 year old female with a past psychiatric history significant for cocaine dependence, alcohol dependence, and most likely opiate use disorder who presented to the [facility] emergency department on 12/22/2018 after an intentional Tylenol overdose. The patient stated she had plan to hang herself, but did not carry out the plan. She stated that she had been living in the home of her boyfriend and his mother, but was unable to return to that home. She stated that after her discharge from the hospital in 08/2018 she did not follow-up or go to any residential or outpatient substance abuse treatment program. .  April [name] is seen, chart reviewed. The chart findings discussed with the treatment team. She presents alert, oriented & aware of situation. She is visible on the unit, attending group sessions. She presents today with reports of feeling a lot better. She denies any racing thoughts today.  She currently denies any SIHI, AVH, delusional thoughts or paranoia. She does not appear to be responding to any internal stimuli. She is taking & tolerating her medication regimen as already in progress. April [name] reports that she is exited about going to the [facility] on Tuesday to continue treatment after discharge. She currently denies any SIHI, AVH, delusional thoughts or paranoia. She is in agreement to continue current plan of care as already in progress.  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 Anxiety   . Bronchitis   . Depression   . Miscarriage     Past Surgical History:  Procedure Laterality Date  . ANKLE SURGERY     left   Family History:  Family History  Problem Relation Age of Onset  . Hypertension Mother   . Diabetes Other   . Cancer Other   . Hypertension Other    Family Psychiatric  History: See H&P  Social History:  Social History   Substance and Sexual Activity  Alcohol Use Yes  . Alcohol/week: 11.0 standard drinks  . Types: 11 Cans of beer per week   Comment: daily     Social History   Substance and Sexual Activity  Drug Use Yes  . Types: Cocaine   Comment: last use 12/21/18    Social History   Socioeconomic History  . Marital status: Single    Spouse name: Not on file  . Number of children: Not on file  . Years of education: Not on file  . Highest education level: Not on file  Occupational History  . Not on file  Social Needs  . Financial resource strain: Not on file  . Food insecurity    Worry: Not on file    Inability: Not on file  . Transportation needs    Medical: Not on file    Non-medical: Not on file  Tobacco Use  . Smoking status: Current Every Day Smoker    Packs/day: 0.50  Years: 12.00    Pack years: 6.00    Types: Cigarettes  . Smokeless tobacco: Never Used  Substance and Sexual Activity  . Alcohol use: Yes    Alcohol/week: 11.0 standard drinks    Types: 11 Cans of beer per week    Comment: daily  . Drug use: Yes    Types: Cocaine    Comment: last use 12/21/18  . Sexual activity: Not Currently  Lifestyle  . Physical activity    Days per week: Not on file    Minutes per session: Not on file  . Stress: Not on file  Relationships  . Social Musician on phone: Not on file    Gets together: Not on file    Attends religious  service: Not on file    Active member of club or organization: Not on file    Attends meetings of clubs or organizations: Not on file    Relationship status: Not on file  Other Topics Concern  . Not on file  Social History Narrative  . Not on file   Additional Social History:   Sleep: Good  Appetite:  Good  Current Medications: Current Facility-Administered Medications  Medication Dose Route Frequency Provider Last Rate Last Dose  . albuterol (VENTOLIN HFA) 108 (90 Base) MCG/ACT inhaler 2 puff  2 puff Inhalation Q6H PRN Antonieta Pert, MD      . citalopram (CELEXA) tablet 20 mg  20 mg Oral Daily Armandina Stammer I, NP   20 mg at 12/27/18 0806  . cyclobenzaprine (FLEXERIL) tablet 10 mg  10 mg Oral TID PRN Antonieta Pert, MD   10 mg at 12/27/18 4098  . feeding supplement (ENSURE ENLIVE) (ENSURE ENLIVE) liquid 237 mL  237 mL Oral BID BM Antonieta Pert, MD   237 mL at 12/27/18 1100  . gabapentin (NEURONTIN) capsule 300 mg  300 mg Oral TID Antonieta Pert, MD   300 mg at 12/27/18 1218  . hydrOXYzine (ATARAX/VISTARIL) tablet 25 mg  25 mg Oral Q6H PRN Antonieta Pert, MD   25 mg at 12/27/18 0811  . ibuprofen (ADVIL) tablet 400 mg  400 mg Oral Q6H PRN Antonieta Pert, MD   400 mg at 12/27/18 1221  . lidocaine (LIDODERM) 5 % 1 patch  1 patch Transdermal Q24H Antonieta Pert, MD   1 patch at 12/27/18 [phone]  . LORazepam (ATIVAN) tablet 1 mg  1 mg Oral Q6H PRN Antonieta Pert, MD   1 mg at 12/25/18 1448  . nicotine (NICODERM CQ - dosed in mg/24 hours) patch 21 mg  21 mg Transdermal Daily Antonieta Pert, MD   21 mg at 12/27/18 4782  . prenatal multivitamin tablet 1 tablet  1 tablet Oral Q1200 Antonieta Pert, MD   1 tablet at 12/27/18 1218  . risperiDONE (RISPERDAL) tablet 1 mg  1 mg Oral QHS ,  I, NP   1 mg at 12/26/18 2113  . traZODone (DESYREL) tablet 50 mg  50 mg Oral QHS PRN Antonieta Pert, MD   50 mg at 12/26/18 2243   Lab Results:  No results  found for this or any previous visit (from the past 48 hour(s)).  Blood Alcohol level:  Lab Results  Component Value Date   ETH 87 (H) 12/22/2018   ETH 201 (H) 09/01/2018   Metabolic Disorder Labs: Lab Results  Component Value Date   HGBA1C 5.0 12/24/2018   MPG 96.8 12/24/2018  MPG 99.67 09/03/2018   No results found for: PROLACTIN Lab Results  Component Value Date   CHOL 155 12/24/2018   TRIG 102 12/24/2018   HDL 56 12/24/2018   CHOLHDL 2.8 12/24/2018   VLDL 20 12/24/2018   LDLCALC 79 12/24/2018   Physical Findings: AIMS: Facial and Oral Movements Muscles of Facial Expression: None, normal Lips and Perioral Area: None, normal Jaw: None, normal Tongue: None, normal,Extremity Movements Upper (arms, wrists, hands, fingers): None, normal Lower (legs, knees, ankles, toes): None, normal, Trunk Movements Neck, shoulders, hips: None, normal, Overall Severity Severity of abnormal movements (highest score from questions above): None, normal Incapacitation due to abnormal movements: None, normal Patient's awareness of abnormal movements (rate only patient's report): No Awareness, Dental Status Current problems with teeth and/or dentures?: No Does patient usually wear dentures?: No  CIWA:  CIWA-Ar Total: 1 COWS:  COWS Total Score: 2  Musculoskeletal: Strength & Muscle Tone: within normal limits Gait & Station: normal Patient leans: N/A  Psychiatric Specialty Exam: Physical Exam  Nursing note and vitals reviewed. Constitutional: She appears well-developed.  Psychiatric: She has a normal mood and affect. Her behavior is normal.    Review of Systems  Psychiatric/Behavioral: Positive for depression and substance abuse. The patient is nervous/anxious.   All other systems reviewed and are negative.   Blood pressure 101/67, pulse (!) 106, temperature 98 F (36.7 C), temperature source Oral, resp. rate 14, height 5\' 4"  (1.626 m), weight 54.4 kg, SpO2 99 %.Body mass index is  20.6 kg/m.  General Appearance: Casual  Eye Contact:  Fair  Speech:  Normal Rate  Volume:  Normal  Mood: "Improving"  Affect:  Congruent  Thought Process:  Coherent and Descriptions of Associations: Intact  Orientation:  Full (Time, Place, and Person)  Thought Content: Logical, denies any hallucinations, delusions or paranoia.  Suicidal Thoughts:  No  Homicidal Thoughts:  No  Memory:  Immediate;   Fair Recent;   Fair Remote;   Fair  Judgement:  Intact  Insight:  Fair  Psychomotor Activity:  Normal  Concentration:  Concentration: Fair and Attention Span: Fair  Recall:  [employer]Fair  Fund of Knowledge:  Fair  Language:  Good  Akathisia:  Negative  Handed:  Right  AIMS (if indicated):     Assets:  Desire for Improvement Resilience  ADL's:  Intact  Cognition:  WNL    Sleep:  Number of Hours: 6.75   Treatment Plan Summary: Daily contact with patient to assess and evaluate symptoms and progress in treatment and Medication management   -Continue inpatient hospitalization.  -Will continue today 12/27/2018 plan as below except where it is noted.  -Mood control            -Continue Risperdal 1 mg po Q hs.  -Depression.            -Continue Citalopram 20 mg po daily.   -Anxiety  -Continue atarax 25 mg po q6h prn anxiety.             -Continue Lorazepam 1 mg po Q 6 hours prn for CIWA > 10.  -Insomnia  -Continue Trazodone 50 mg po prn q hs.  -Agitation/pain              -Continue Flexeril 10 mg po tid prn for muscle spasms.              -Continue Gabapentin 300 mg po tid.              -Continue Lidocaine  patch 5% topically Q 24 hours for pain  -Continue Albuterol inhaler 2 puffs Q 6 hrs prn for SOB  -Encourage participation in groups and therapeutic milieu  -Disposition planning will be ongoing  Armandina Stammer, NP, PMHNP, FNP-BC 12/27/2018, 1:07 PM Patient ID: April [name], female   DOB: September 01, 1980, 38 y.o.   MRN: [id number]

## 2018-12-27 NOTE — Plan of Care (Signed)
Nurse discussed anxiety, depression and coping skills with patient.  

## 2018-12-28 NOTE — Progress Notes (Signed)
Recreation Therapy Notes  Date:  12.7.20 Time: 0930 Location: [address] Dayroom  Group Topic: Stress Management  Goal Area(s) Addresses:  Patient will identify positive stress management techniques. Patient will identify benefits of using stress management post d/c.  Behavioral Response:  Engaged  Intervention: Stress Management  Activity :  Guided Imagery.  LRT read a script that guided patients on a relaxing [place]  Patients were to listen and follow along as LRT read script to engage in activity.    Education:  Stress Management, Discharge Planning.   Education Outcome: Acknowledges Education  Clinical Observations/Feedback: Pt attended and participated in activity.     Victorino Sparrow, LRT/CTRS         Victorino Sparrow A 12/28/2018 12:05 PM

## 2018-12-28 NOTE — Progress Notes (Signed)
Spiritual care group on grief and loss facilitated by chaplain Jerene Pitch MDiv, B[facility]  Group Goal:  Support / Education around grief and loss Members engage in facilitated group support and psycho-social education.  Group Description:  Following introductions and group rules, group members engaged in facilitated group dialog and support around topic of loss, with particular support around experiences of loss in their lives. Group Identified types of loss (relationships / self / things) and identified patterns, circumstances, and changes that precipitate losses. Reflected on thoughts / feelings around loss, normalized grief responses, and recognized variety in grief experience.   Group noted Worden's four tasks of grief in discussion.  Group drew on Adlerian / Rogerian, narrative, MI, Patient Progress:  [place] was present throughout group with exception of being pulled to speak with MD.  Mickel Baas identified with others in group who spoke about challenge of finding self outside of substance use.  Elleni noted some resonance with a group member who spoke about the process of claiming his worth and finding forgiveness.

## 2018-12-28 NOTE — Progress Notes (Signed)
DAR NOTE: Patient presents with anxious affect and depressed mood.  Denies denies suicidal thoughts, auditory and visual hallucinations.  Described energy level as low and concentration as good.  Rates depression at 5, hopelessness at 5, and anxiety at 6.  Maintained on routine safety checks.  Medications given as prescribed.  Support and encouragement offered as needed.  Attended group and participated.  States goal for today is "prepare myself for leaving tomorrow."  Patient visible in milieu with minimal interaction.  Patient is safe on and off the unit.

## 2018-12-28 NOTE — Progress Notes (Addendum)
[facility] MD Progress Note  12/28/2018 12:35 PM April [name]  MRN:  [id number] Subjective:  "I'm nervous but ready for rehab."  April [name] has been visible on the unit, participating in groups and interacting appropriately. She presents with euthymic affect and reports mood is significantly improved from admission. She is eager to continue treatment with transfer to [facility] for rehab tomorrow. She has plans after [facility] to stay with a friend who has been supportive. She is future-oriented and reports good sleep/appetite. She reports medication has been helpful. She denies medication side effects. Denies SI/HI/AVH. Denies withdrawal symptoms.  From admission H&P: Patient is a 38 year old female with a past psychiatric history significant for cocaine dependence, alcohol dependence, and most likely opiate use disorder who presented to the [facility] emergency department on 12/22/2018 after an intentional Tylenol overdose.   Principal Problem: MDD (major depressive disorder) Diagnosis: Principal Problem:   MDD (major depressive disorder)  Total Time spent with patient: 15 minutes  Past Psychiatric History: See admission H&P  Past Medical History:  Past Medical History:  Diagnosis Date  . Anxiety   . Bronchitis   . Depression   . Miscarriage     Past Surgical History:  Procedure Laterality Date  . ANKLE SURGERY     left   Family History:  Family History  Problem Relation Age of Onset  . Hypertension Mother   . Diabetes Other   . Cancer Other   . Hypertension Other    Family Psychiatric  History: See admission H&P Social History:  Social History   Substance and Sexual Activity  Alcohol Use Yes  . Alcohol/week: 11.0 standard drinks  . Types: 11 Cans of beer per week   Comment: daily     Social History   Substance and Sexual Activity  Drug Use Yes  . Types: Cocaine   Comment: last use 12/21/18    Social History   Socioeconomic History  . Marital status: Single    Spouse  name: Not on file  . Number of children: Not on file  . Years of education: Not on file  . Highest education level: Not on file  Occupational History  . Not on file  Social Needs  . Financial resource strain: Not on file  . Food insecurity    Worry: Not on file    Inability: Not on file  . Transportation needs    Medical: Not on file    Non-medical: Not on file  Tobacco Use  . Smoking status: Current Every Day Smoker    Packs/day: 0.50    Years: 12.00    Pack years: 6.00    Types: Cigarettes  . Smokeless tobacco: Never Used  Substance and Sexual Activity  . Alcohol use: Yes    Alcohol/week: 11.0 standard drinks    Types: 11 Cans of beer per week    Comment: daily  . Drug use: Yes    Types: Cocaine    Comment: last use 12/21/18  . Sexual activity: Not Currently  Lifestyle  . Physical activity    Days per week: Not on file    Minutes per session: Not on file  . Stress: Not on file  Relationships  . Social Herbalist on phone: Not on file    Gets together: Not on file    Attends religious service: Not on file    Active member of club or organization: Not on file    Attends meetings of clubs or organizations:  Not on file    Relationship status: Not on file  Other Topics Concern  . Not on file  Social History Narrative  . Not on file   Additional Social History:                         Sleep: Good  Appetite:  Good  Current Medications: Current Facility-Administered Medications  Medication Dose Route Frequency Provider Last Rate Last Dose  . albuterol (VENTOLIN HFA) 108 (90 Base) MCG/ACT inhaler 2 puff  2 puff Inhalation Q6H PRN Clary, Greg Lawson, MD      . citalopram (CELEXA) tablet 20 mg  20 mg Oral Daily Armandina StammerNwoko, Agnes I, NP   20 mg at 12/28/18 0808  . cyclobenzaprine (FLEXERIAntonieta PertL) tablet 10 mg  10 mg Oral TID PRN Antonieta Pertlary, Greg Lawson, MD   10 mg at 12/27/18 [id number]  . feeding supplement (ENSURE ENLIVE) (ENSURE ENLIVE) liquid 237 mL  237 mL Oral  BID BM Antonieta Pertlary, Greg Lawson, MD   237 mL at 12/27/18 1500  . gabapentin (NEURONTIN) capsule 300 mg  300 mg Oral TID Antonieta Pertlary, Greg Lawson, MD   300 mg at 12/28/18 1150  . hydrOXYzine (ATARAX/VISTARIL) tablet 25 mg  25 mg Oral Q6H PRN Antonieta Pertlary, Greg Lawson, MD   25 mg at 12/28/18 [id number]  . ibuprofen (ADVIL) tablet 400 mg  400 mg Oral Q6H PRN Antonieta Pertlary, Greg Lawson, MD   400 mg at 12/28/18 0808  . lidocaine (LIDODERM) 5 % 1 patch  1 patch Transdermal Q24H Antonieta Pertlary, Greg Lawson, MD   1 patch at 12/27/18 [id number]  . LORazepam (ATIVAN) tablet 1 mg  1 mg Oral Q6H PRN Antonieta Pertlary, Greg Lawson, MD   1 mg at 12/25/18 1448  . nicotine (NICODERM CQ - dosed in mg/24 hours) patch 21 mg  21 mg Transdermal Daily Antonieta Pertlary, Greg Lawson, MD   21 mg at 12/28/18 [id number]  . prenatal multivitamin tablet 1 tablet  1 tablet Oral Q1200 Antonieta Pertlary, Greg Lawson, MD   1 tablet at 12/28/18 1150  . risperiDONE (RISPERDAL) tablet 1 mg  1 mg Oral QHS Nwoko, Agnes I, NP   1 mg at 12/27/18 2149  . traZODone (DESYREL) tablet 50 mg  50 mg Oral QHS PRN Antonieta Pertlary, Greg Lawson, MD   50 mg at 12/27/18 2149    Lab Results: No results found for this or any previous visit (from the past 48 hour(s)).  Blood Alcohol level:  Lab Results  Component Value Date   ETH 87 (H) 12/22/2018   ETH 201 (H) 09/01/2018    Metabolic Disorder Labs: Lab Results  Component Value Date   HGBA1C 5.0 12/24/2018   MPG 96.8 12/24/2018   MPG 99.67 09/03/2018   No results found for: PROLACTIN Lab Results  Component Value Date   CHOL 155 12/24/2018   TRIG 102 12/24/2018   HDL 56 12/24/2018   CHOLHDL 2.8 12/24/2018   VLDL 20 12/24/2018   LDLCALC 79 12/24/2018    Physical Findings: AIMS: Facial and Oral Movements Muscles of Facial Expression: None, normal Lips and Perioral Area: None, normal Jaw: None, normal Tongue: None, normal,Extremity Movements Upper (arms, wrists, hands, fingers): None, normal Lower (legs, knees, ankles, toes): None, normal, Trunk Movements Neck, shoulders,  hips: None, normal, Overall Severity Severity of abnormal movements (highest score from questions above): None, normal Incapacitation due to abnormal movements: None, normal Patient's awareness of abnormal movements (rate only patient's report): No Awareness, Dental Status Current problems with teeth  and/or dentures?: No Does patient usually wear dentures?: No  CIWA:  CIWA-Ar Total: 1 COWS:  COWS Total Score: 2  Musculoskeletal: Strength & Muscle Tone: within normal limits Gait & Station: normal Patient leans: N/A  Psychiatric Specialty Exam: Physical Exam  Nursing note and vitals reviewed. Constitutional: She is oriented to person, place, and time. She appears well-developed and well-nourished.  Respiratory: Effort normal.  Musculoskeletal: Normal range of motion.  Neurological: She is alert and oriented to person, place, and time.    Review of Systems  Constitutional: Negative.   Respiratory: Negative for cough and shortness of breath.   Cardiovascular: Negative for chest pain.  Gastrointestinal: Negative for nausea and vomiting.  Neurological: Negative for headaches.  Psychiatric/Behavioral: Positive for depression (stable on medication) and substance abuse. Negative for hallucinations and suicidal ideas. The patient is not nervous/anxious and does not have insomnia.     Blood pressure 102/75, pulse (!) 111, temperature 98 F (36.7 C), temperature source Oral, resp. rate 16, height 5\' 4"  (1.626 m), weight 54.4 kg, SpO2 100 %.Body mass index is 20.6 kg/m.  General Appearance: Casual  Eye Contact:  Good  Speech:  Normal Rate  Volume:  Normal  Mood:  Euthymic  Affect:  Appropriate and Congruent  Thought Process:  Coherent and Goal Directed  Orientation:  Full (Time, Place, and Person)  Thought Content:  Logical  Suicidal Thoughts:  No  Homicidal Thoughts:  No  Memory:  Immediate;   Good Recent;   Good  Judgement:  Intact  Insight:  Fair  Psychomotor Activity:  Normal   Concentration:  Concentration: Good and Attention Span: Good  Recall:  Good  Fund of Knowledge:  Fair  Language:  Good  Akathisia:  No  Handed:  Right  AIMS (if indicated):     Assets:  Communication Skills Desire for Improvement Housing Resilience Social Support  ADL's:  Intact  Cognition:  WNL  Sleep:  Number of Hours: 6     Treatment Plan Summary: Daily contact with patient to assess and evaluate symptoms and progress in treatment and Medication management   Continue inpatient hospitalization.  Continue Celexa 20 mg PO daily for mood Continue Neurontin 300 mg PO TID for mood/anxiety Continue Risperdal 1 mg PO QHS for mood Continue Vistaril 25 mg PO Q6HR PRN anxiety Continue Flexeril 10 mg PO TID PRN muscle spasms Continue lidocaine 5% patch transdermal Q12HR PRN pain Continue trazodone 50 mg PO QHS PRN insomnia  Patient will participate in the therapeutic group milieu.  Discharge disposition in progress.   , NP 12/28/2018, 12:35 PM   Attest to NP note

## 2018-12-28 NOTE — Progress Notes (Signed)
   12/28/18 2103  Psych Admission Type (Psych Patients Only)  Admission Status Voluntary  Psychosocial Assessment  Patient Complaints Anxiety  Eye Contact Fair  Facial Expression Flat  Affect Appropriate to circumstance  Speech Logical/coherent  Interaction Assertive  Motor Activity Other (Comment) (WDL)  Appearance/Hygiene Unremarkable  Behavior Characteristics Cooperative  Mood Anxious  Thought Process  Coherency WDL  Content WDL  Delusions None reported or observed  Perception WDL  Hallucination None reported or observed  Judgment Impaired  Confusion None  Danger to Self  Current suicidal ideation? Denies  Danger to Others  Danger to Others None reported or observed  D: Patient in dayroom interacting with peers. Pt reports feeling anxious and hopeful about discharge tomorrow.  A: Medications administered as prescribed. Support and encouragement provided as needed.  R: Patient remains safe on the unit. Will continue to monitor for safety and stability.

## 2018-12-28 NOTE — BHH Group Notes (Signed)
LCSW Group Therapy Note 12/28/2018 1:54 PM  Type of Therapy and Topic: Group Therapy: Overcoming Obstacles  Participation Level: Did Not Attend  Description of Group:  In this group patients will be encouraged to explore what they see as obstacles to their own wellness and recovery. They will be guided to discuss their thoughts, feelings, and behaviors related to these obstacles. The group will process together ways to cope with barriers, with attention given to specific choices patients can make. Each patient will be challenged to identify changes they are motivated to make in order to overcome their obstacles. This group will be process-oriented, with patients participating in exploration of their own experiences as well as giving and receiving support and challenge from other group members.  Therapeutic Goals: 1. Patient will identify personal and current obstacles as they relate to admission. 2. Patient will identify barriers that currently interfere with their wellness or overcoming obstacles.  3. Patient will identify feelings, thought process and behaviors related to these barriers. 4. Patient will identify two changes they are willing to make to overcome these obstacles:   Summary of Patient Progress  Invited, chose not to attend. Patient was asleep in their room.    Therapeutic Modalities:  Cognitive Behavioral Therapy Solution Focused Therapy Motivational Interviewing Relapse Prevention Therapy   Theresa Duty Clinical Social Worker

## 2018-12-28 NOTE — BHH Group Notes (Signed)
Adult Psychoeducational Group Note  Date:  12/28/2018 Time:  9:46 PM  Group Topic/Focus:  Wrap-Up Group:   The focus of this group is to help patients review their daily goal of treatment and discuss progress on daily workbooks.  Participation Level:  Active  Participation Quality:  Appropriate  Affect:  Appropriate  Cognitive:  Appropriate  Insight: Appropriate  Engagement in Group:  Engaged  Modes of Intervention:  Discussion  Additional Comments:  Pt stated her goal was get situated for discharge tomorrow.  Pt did meet her goal and rated the day at 9/10.  Rossie Bretado 12/28/2018, 9:46 PM

## 2018-12-29 DIAGNOSIS — F1424 Cocaine dependence with cocaine-induced mood disorder: Secondary | ICD-10-CM

## 2018-12-29 MED ORDER — ALBUTEROL SULFATE HFA 108 (90 BASE) MCG/ACT IN AERS: 2.0000 | INHALATION_SPRAY | Freq: Four times a day (QID) | RESPIRATORY_TRACT | 0 refills | Status: DC | PRN

## 2018-12-29 MED ORDER — HYDROXYZINE HCL 25 MG PO TABS: 25.0000 mg | ORAL_TABLET | Freq: Four times a day (QID) | ORAL | 0 refills | Status: DC | PRN

## 2018-12-29 MED ORDER — PRENATAL MULTIVITAMIN CH: 1.0000 | ORAL_TABLET | Freq: Every day | ORAL | Status: AC

## 2018-12-29 MED ORDER — GABAPENTIN 300 MG PO CAPS: 300.0000 mg | ORAL_CAPSULE | Freq: Three times a day (TID) | ORAL | 0 refills | Status: DC

## 2018-12-29 MED ORDER — LIDOCAINE 5 % EX PTCH: 1.0000 | MEDICATED_PATCH | CUTANEOUS | 0 refills | Status: DC

## 2018-12-29 MED ORDER — TRAZODONE HCL 50 MG PO TABS: 50.0000 mg | ORAL_TABLET | Freq: Every evening | ORAL | 0 refills | Status: DC | PRN

## 2018-12-29 MED ORDER — RISPERIDONE 1 MG PO TABS: 1.0000 mg | ORAL_TABLET | Freq: Every day | ORAL | 0 refills | Status: DC

## 2018-12-29 MED ORDER — NICOTINE 21 MG/24HR TD PT24: 21.0000 mg | MEDICATED_PATCH | Freq: Every day | TRANSDERMAL | 0 refills | Status: DC

## 2018-12-29 MED ORDER — CITALOPRAM HYDROBROMIDE 20 MG PO TABS: 20.0000 mg | ORAL_TABLET | Freq: Every day | ORAL | 0 refills | Status: DC

## 2018-12-29 MED ORDER — CYCLOBENZAPRINE HCL 10 MG PO TABS: 10.0000 mg | ORAL_TABLET | Freq: Three times a day (TID) | ORAL | 0 refills | Status: DC | PRN

## 2018-12-29 NOTE — BHH Suicide Risk Assessment (Signed)
[facility] Discharge Suicide Risk Assessment   Principal Problem: MDD (major depressive disorder) Discharge Diagnoses: Principal Problem:   MDD (major depressive disorder)   Total Time spent with patient: 30 minutes  Musculoskeletal: Strength & Muscle Tone: within normal limits Gait & Station: normal Patient leans: N/A  Psychiatric Specialty Exam: ROS no headache, no chest pain, no cough, no vomiting , no rash  Blood pressure 92/73, pulse (!) 103, temperature 98 F (36.7 C), temperature source Oral, resp. rate 16, height 5\' 4"  (1.626 m), weight 54.4 kg, SpO2 99 %.Body mass index is 20.6 kg/m.  General Appearance: Well Groomed  Eye Contact::  Good  Speech:  Normal Rate409  Volume:  Normal  Mood:  reports improving mood, feels " better"  Affect:  fuller in range  Thought Process:  Linear and Descriptions of Associations: Intact  Orientation:  Full (Time, Place, and Person)  Thought Content:  no hallucinations, no delusions   Suicidal Thoughts:  No denies suicidal or self injurious ideations, denies homicidal or violent ideations  Homicidal Thoughts:  No  Memory:  recent and remote grossly intact   Judgement:  Other:  improving   Insight:  improving  Psychomotor Activity:  Normal  Concentration:  Good  Recall:  Good  Fund of Knowledge:Good  Language: Good  Akathisia:  Negative  Handed:  Right  AIMS (if indicated):     Assets:  Desire for Improvement Resilience  Sleep:  Number of Hours: 6.5  Cognition: WNL  ADL's:  Intact   Mental Status Per Nursing Assessment::   On Admission:  Self-harm behaviors  Demographic Factors:  80, lives with boyfriend,  has one son who is 19  Loss Factors: Substance abuse, relationship stressors  Historical Factors: History of depression, history of cocaine use disorder   Risk Reduction Factors:   Sense of responsibility to family and Positive coping skills or problem solving skills  Continued Clinical Symptoms:  At this time patient is  alert, attentive, well groomed, mood improved , affect appropriate and reactive, no thought disorder, no suicidal or self injurious ideations, no homicidal or violent ideations , no hallucinations , no delusions, future oriented . Behavior on unit in good control, pleasant on approach, interacting appropriately with peers. Denies medication side effects, which we reviewed.  Cognitive Features That Contribute To Risk:  No gross cognitive deficits noted upon discharge. Is alert , attentive, and oriented x 3   Suicide Risk:  Mild:  Suicidal ideation of limited frequency, intensity, duration, and specificity.  There are no identifiable plans, no associated intent, mild dysphoria and related symptoms, good self-control (both objective and subjective assessment), few other risk factors, and identifiable protective factors, including available and accessible social support.  Follow-up [place] [name]. Go on 12/29/2018.   Why: You have been accepted for residential treatment on 12/29/18 at 11:30am. You need to bring your hospital discharge paperwork, a 14 day supply of medication, and clothing. Contact information: [place] [address] [phone]        Services, [facility]. Call.   Why: Upon completion of [facility] program, please follow up for any outpatient psychiatric services such as medication management and therapy. Please call number listed to establish services. Contact information: [place] [address] [phone]           Plan Of Care/Follow-up recommendations:  Activity:  as tolerated  Diet:  regular Tests:  NA Other:  See below  Patient is expressing readiness for discharge and is leaving unit in good spirits  Plans to go to [facility] for further residential substance abuse treatment.  Craige Cotta, MD 12/29/2018, 8:13 AM

## 2018-12-29 NOTE — Progress Notes (Signed)
Pt discharged to lobby. Pt was stable and appreciative at that time. All papers and prescriptions were given and valuables returned. Verbal understanding expressed. Denies SI/HI and A/VH. Pt given opportunity to express concerns and ask questions.  

## 2018-12-29 NOTE — Progress Notes (Signed)
  [facility] Adult Case Management Discharge Plan :  Will you be returning to the same living situation after discharge:  No. Patient discharged to [facility] for continuity of care/treatment.  At discharge, do you have transportation home?: Yes,  [place] ([employer]) transport  Do you have the ability to pay for your medications: No.  Release of information consent forms completed and in the chart;  Patient's signature needed at discharge.  Patient to Follow up at: Follow-up [name]. Go on 12/29/2018.   Why: You have been accepted for residential treatment on 12/29/18 at 11:30am. You need to bring your hospital discharge paperwork, a 14 day supply of medication, and clothing. Contact information: [place] [address] [phone]        Services, [facility]. Call.   Why: Upon completion of [facility] program, please follow up for any outpatient psychiatric services such as medication management and therapy. Please call number listed to establish services. Contact information: [place] [address] [phone]           Next level of care provider has access to [place] and Suicide Prevention discussed: Yes,  with the patient's friend     Has patient been referred to the Quitline?: N/A patient is not a smoker  Patient has been referred for addiction treatment: Yes  Marylee Floras, [place] 12/29/2018, 2:07 PM

## 2018-12-29 NOTE — Discharge Summary (Addendum)
Physician Discharge Summary Note  Patient:  April [name] is an 38 y.o., female MRN:  [id number] DOB:  04/03/1980 Patient phone:  [phone] (home)  Patient address:   [address] [place] [address],  Total Time spent with patient: 15 minutes  Date of Admission:  12/23/2018 Date of Discharge: 12/29/18  Reason for Admission: Tylenol overdose  Principal Problem: MDD (major depressive disorder) Discharge Diagnoses: Principal Problem:   MDD (major depressive disorder) Active Problems:   Cocaine dependence with cocaine-induced mood disorder [facility])   Past Psychiatric History: This is her third psychiatric hospitalization.  She was last hospitalized at our facility on 09/02/2018.  She has 1 previous admission at [facility] in the past.  She has been on multiple antidepressant medications in the past.  Past Medical History:  Past Medical History:  Diagnosis Date  . Anxiety   . Bronchitis   . Depression   . Miscarriage     Past Surgical History:  Procedure Laterality Date  . ANKLE SURGERY     left   Family History:  Family History  Problem Relation Age of Onset  . Hypertension Mother   . Diabetes Other   . Cancer Other   . Hypertension Other    Family Psychiatric  History: Denies Social History:  Social History   Substance and Sexual Activity  Alcohol Use Yes  . Alcohol/week: 11.0 standard drinks  . Types: 11 Cans of beer per week   Comment: daily     Social History   Substance and Sexual Activity  Drug Use Yes  . Types: Cocaine   Comment: last use 12/21/18    Social History   Socioeconomic History  . Marital status: Single    Spouse name: Not on file  . Number of children: Not on file  . Years of education: Not on file  . Highest education level: Not on file  Occupational History  . Not on file  Social Needs  . Financial resource strain: Not on file  . Food insecurity    Worry: Not on file    Inability: Not on file  .  Transportation needs    Medical: Not on file    Non-medical: Not on file  Tobacco Use  . Smoking status: Current Every Day Smoker    Packs/day: 0.50    Years: 12.00    Pack years: 6.00    Types: Cigarettes  . Smokeless tobacco: Never Used  Substance and Sexual Activity  . Alcohol use: Yes    Alcohol/week: 11.0 standard drinks    Types: 11 Cans of beer per week    Comment: daily  . Drug use: Yes    Types: Cocaine    Comment: last use 12/21/18  . Sexual activity: Not Currently  Lifestyle  . Physical activity    Days per week: Not on file    Minutes per session: Not on file  . Stress: Not on file  Relationships  . Social Musician on phone: Not on file    Gets together: Not on file    Attends religious service: Not on file    Active member of club or organization: Not on file    Attends meetings of clubs or organizations: Not on file    Relationship status: Not on file  Other Topics Concern  . Not on file  Social History Narrative  . Not on file    Hospital Course:  From admission H&P: Patient is  a 38 year old female with a past psychiatric history significant for cocaine dependence, alcohol dependence, and most likely opiate use disorder who [facility] to [facility] emergency department on 12/22/2018 after an intentional Tylenol overdose. The patient stated she had plan to hang herself, but did not carry out the plan. She stated that she had been living in the home of her boyfriend and his mother, but was unable to return to that home. She stated that after her discharge from the hospital in 08/2018 she did not follow-up or go to any residential or outpatient substance abuse treatment program. Her laboratories revealed an elevated Tylenol level at 55 in their emergency department. Her drug screen was positive for cocaine. She was transferred to our facility for continued treatment.  April [name] was admitted after overdose on Tylenol. UDS was positive for  cocaine, with BAL 87. She had [facility] but had relapsed on cocaine and alcohol shortly after discharge. [facility]. CIWA protocol was started with Ativan PRN CIWA>10 for ETOH withdrawal. She was started on Celexa, Risperdal, PRN Vistaril, and PRN trazodone. She participated in group therapy on the unit. She responded well to treatment with no adverse effects reported. She has shown significantly improved mood, affect, sleep, and interaction. She requested transfer to inpatient rehab and has been accepted to [facility]. She plans to stay with a friend after [facility]. She denies any SI/HI/AVH and contracts for safety. She denies withdrawal symptoms. She is discharging on the medications listed below. She agrees to follow up at [facility] and [facility] (see below). Patient is provided with prescriptions for medications upon discharge. She is discharging to [facility].  Physical Findings: AIMS: Facial and Oral Movements Muscles of Facial Expression: None, normal Lips and Perioral Area: None, normal Jaw: None, normal Tongue: None, normal,Extremity Movements Upper (arms, wrists, hands, fingers): None, normal Lower (legs, knees, ankles, toes): None, normal, Trunk Movements Neck, shoulders, hips: None, normal, Overall Severity Severity of abnormal movements (highest score from questions above): None, normal Incapacitation due to abnormal movements: None, normal Patient's awareness of abnormal movements (rate only patient's report): No Awareness, Dental Status Current problems with teeth and/or dentures?: No Does patient usually wear dentures?: No  CIWA:  CIWA-Ar Total: 1 COWS:  COWS Total Score: 2  Musculoskeletal: Strength & Muscle Tone: within normal limits Gait & Station: normal Patient leans: N/A  Psychiatric Specialty Exam: Physical Exam  Nursing note and vitals reviewed. Constitutional: She is oriented to person, place, and time. She appears  well-developed and well-nourished.  Cardiovascular: Normal rate.  Respiratory: Effort normal.  Neurological: She is alert and oriented to person, place, and time.    Review of Systems  Constitutional: Negative.   Respiratory: Negative for cough and shortness of breath.   Cardiovascular: Negative for chest pain.  Gastrointestinal: Negative for nausea and vomiting.  Neurological: Negative for tremors, sensory change and headaches.  Psychiatric/Behavioral: Positive for depression (stable on medication) and substance abuse. Negative for hallucinations and suicidal ideas. The patient is not nervous/anxious and does not have insomnia.     Blood pressure 92/73, pulse (!) 103, temperature 98 F (36.7 C), temperature source Oral, resp. rate 16, height 5\' 4"  (1.626 m), weight 54.4 kg, SpO2 99 %.Body mass index is 20.6 kg/m.  See MD's discharge SRA      Has this patient used any form of tobacco in the last 30 days? (Cigarettes, Smokeless Tobacco, Cigars, and/or Pipes) Yes, a prescription for  an FDA-approved medication for tobacco cessation was offered at discharge.   Blood Alcohol level:  Lab Results  Component Value Date   ETH 87 (H) 12/22/2018   ETH 201 (H) 09/01/2018    Metabolic Disorder Labs:  Lab Results  Component Value Date   HGBA1C 5.0 12/24/2018   MPG 96.8 12/24/2018   MPG 99.67 09/03/2018   No results found for: PROLACTIN Lab Results  Component Value Date   CHOL 155 12/24/2018   TRIG 102 12/24/2018   HDL 56 12/24/2018   CHOLHDL 2.8 12/24/2018   VLDL 20 12/24/2018   LDLCALC 79 12/24/2018    See Psychiatric Specialty Exam and Suicide Risk Assessment completed by Attending Physician prior to discharge.  Discharge destination:  Home  Is patient on multiple antipsychotic therapies at discharge:  No   Has Patient had three or more failed trials of antipsychotic monotherapy by history:  No  Recommended Plan for Multiple Antipsychotic Therapies: NA  Discharge  Instructions    Discharge instructions   Complete by: As directed    Patient is instructed to take all prescribed medications as recommended. Report any side effects or adverse reactions to your outpatient psychiatrist. Patient is instructed to abstain from alcohol and illegal drugs while on prescription medications. In the event of worsening symptoms, patient is instructed to call the crisis hotline, 911, or go to the nearest emergency department for evaluation and treatment.     Allergies as of 12/29/2018      Reactions   Other Hives   Cashews      Medication List    STOP taking these medications   atomoxetine 10 MG capsule Commonly known as: STRATTERA   nicotine polacrilex 2 MG gum Commonly known as: NICORETTE     TAKE these medications     Indication  albuterol 108 (90 Base) MCG/ACT inhaler Commonly known as: VENTOLIN HFA Inhale 2 puffs into the lungs every 6 (six) hours as needed for wheezing or shortness of breath. What changed:   reasons to take this  additional instructions  Indication: Asthma   citalopram 20 MG tablet Commonly known as: CELEXA Take 1 tablet (20 mg total) by mouth daily.  Indication: Depression   cyclobenzaprine 10 MG tablet Commonly known as: FLEXERIL Take 1 tablet (10 mg total) by mouth 3 (three) times daily as needed for muscle spasms.  Indication: Muscle Spasm   gabapentin 300 MG capsule Commonly known as: NEURONTIN Take 1 capsule (300 mg total) by mouth 3 (three) times daily.  Indication: Abuse or Misuse of Alcohol   hydrOXYzine 25 MG tablet Commonly known as: ATARAX/VISTARIL Take 1 tablet (25 mg total) by mouth every 6 (six) hours as needed for anxiety.  Indication: Feeling Anxious   lidocaine 5 % Commonly known as: LIDODERM Place 1 patch onto the skin daily. Remove & Discard patch within 12 hours or as directed by MD  Indication: Allodynia, Pain   nicotine 21 mg/24hr patch Commonly known as: NICODERM CQ - dosed in mg/24  hours Place 1 patch (21 mg total) onto the skin daily.  Indication: Nicotine Addiction   prenatal multivitamin Tabs tablet Take 1 tablet by mouth daily at 12 noon.  Indication: Vitamin Deficiency   risperiDONE 1 MG tablet Commonly known as: RISPERDAL Take 1 tablet (1 mg total) by mouth at bedtime.  Indication: Major Depressive Disorder, Mood control   traZODone 50 MG tablet Commonly known as: DESYREL Take 1 tablet (50 mg total) by mouth at bedtime as needed for sleep.  Indication: [facility]. Go on 12/29/2018.   Why: You have been accepted for residential treatment on 12/29/18 at 11:30am. You need to bring your hospital discharge paperwork, a 14 day supply of medication, and clothing. Contact information: [place] [address] [phone]        Services, [facility]. Call.   Why: Upon completion of [facility] program, please follow up for any outpatient psychiatric services such as medication management and therapy. Please call number listed to establish services. Contact information: [place] [address] [phone]           Follow-up recommendations: Activity as tolerated. Diet as recommended by primary care physician. Keep all scheduled follow-up appointments as recommended.   Comments:   Patient is instructed to take all prescribed medications as recommended. Report any side effects or adverse reactions to your outpatient psychiatrist. Patient is instructed to abstain from alcohol and illegal drugs while on prescription medications. In the event of worsening symptoms, patient is instructed to call the crisis hotline, 911, or go to the nearest emergency department for evaluation and treatment.  Signed: Connye Burkitt, NP 12/29/2018, 9:34 AM    Attest to NP note

## 2020-01-22 NOTE — L&D Delivery Note (Signed)
OB/GYN Faculty Practice Delivery Note  April [name] is a 40 y.o. G3P1011 s/p vaginal delivery at [redacted]w[redacted]d. She was admitted for latent labor in the setting of AMA and elevated blood pressure without prior diagnosis of hypertension.   ROM: 0h 48m with clear fluid GBS Status: positive; inadequate antibiotics given precipitous nature of delivery Maximum Maternal Temperature: 98.6F  Labor Progress: Pt initially 1.5cm dilated in MAU. Given patient's gestational age in the setting of AMA and elevated blood pressure, pt was admitted to L&D. Shortly after admission at 0457, pt received a dose of IV fentanyl. She was then found to have complete cervical dilation at 0516 with subsequent SROM for clear fluid at 0520. She then had an uncomplicated delivery as noted below.  Delivery Date/Time: 05/31/20 at 0526 Delivery: Called to room and patient was complete and pushing. Head delivered ROA. No nuchal cord present. Shoulder and body delivered in usual fashion. Infant with spontaneous cry, placed on mother's abdomen, dried and stimulated. Cord clamped x 2 after 1-minute delay, and cut by FOB under my direct supervision. Cord blood drawn. Placenta delivered spontaneously with gentle cord traction. Fundus firm with massage and Pitocin. Labia, perineum, vagina, and cervix were inspected, without evidence of lacerations.  Placenta: intact, 3-vessel cord, sent to pathology Complications: precipitous delivery Lacerations: none EBL: 50 ml Analgesia: IV fentanyl  Infant: viable female  APGARs 4 & 9  weight per medical record  Infant initially placed skin to skin with mom. However, at approximately 60 seconds s/p delivery, infant was noted to have central cyanosis and apnea. Cord was immediately cut and infant brough to warmer while Code APGAR was called. NICU team promptly presented to the bedside. Given infant HR <100, brief PPV was administered at warmer. Infant HR rapidly improved and baby was able to return  skin to skin with mom.  Lynnda Shields, MD OB/GYN Fellow, Faculty Practice

## 2020-04-26 ENCOUNTER — Other Ambulatory Visit: Payer: Self-pay | Admitting: Obstetrics & Gynecology

## 2020-04-26 DIAGNOSIS — Z363 Encounter for antenatal screening for malformations: Secondary | ICD-10-CM

## 2020-04-27 ENCOUNTER — Ambulatory Visit (INDEPENDENT_AMBULATORY_CARE_PROVIDER_SITE_OTHER): Payer: Medicaid Other

## 2020-04-27 ENCOUNTER — Other Ambulatory Visit: Payer: Medicaid Other

## 2020-04-27 ENCOUNTER — Other Ambulatory Visit: Payer: Self-pay

## 2020-04-27 DIAGNOSIS — Z363 Encounter for antenatal screening for malformations: Secondary | ICD-10-CM

## 2020-04-27 DIAGNOSIS — Z3A35 35 weeks gestation of pregnancy: Secondary | ICD-10-CM

## 2020-04-27 NOTE — Progress Notes (Signed)
[place] 35+1 wks,cephalic,anterior placenta gr 3,fhr 150 bpm,LVEICF 1.7 mm,afi 16.8 cm,efw 2498 g 36%,anatomy complete,limited view because of fetal age

## 2020-04-28 LAB — CBC/D/PLT+RPR+RH+ABO+RUB AB...
Antibody Screen: NEGATIVE
Basophils Absolute: 0 10*3/uL (ref 0.0–0.2)
Basos: 0 %
EOS (ABSOLUTE): 0 10*3/uL (ref 0.0–0.4)
Eos: 0 %
HCV Ab: <0.1 s/co ratio (ref 0.0–0.9)
HIV Screen 4th Generation wRfx: NONREACTIVE
Hematocrit: 32.7 % — ABNORMAL LOW (ref 34.0–46.6)
Hemoglobin: 10.7 g/dL — ABNORMAL LOW (ref 11.1–15.9)
Hepatitis B Surface Ag: NEGATIVE
Immature Grans (Abs): 0.1 10*3/uL (ref 0.0–0.1)
Immature Granulocytes: 1 %
Lymphocytes Absolute: 2.6 10*3/uL (ref 0.7–3.1)
Lymphs: 18 %
MCH: 30.1 pg (ref 26.6–33.0)
MCHC: 32.7 g/dL (ref 31.5–35.7)
MCV: 92 fL (ref 79–97)
Monocytes Absolute: 0.7 10*3/uL (ref 0.1–0.9)
Monocytes: 5 %
Neutrophils Absolute: 10.7 10*3/uL — ABNORMAL HIGH (ref 1.4–7.0)
Neutrophils: 76 %
Platelets: 231 10*3/uL (ref 150–450)
RBC: 3.55 x10E6/uL — ABNORMAL LOW (ref 3.77–5.28)
RDW: 13.1 % (ref 11.7–15.4)
RPR Ser Ql: NONREACTIVE
Rh Factor: POSITIVE
Rubella Antibodies, IGG: <0.9 index — ABNORMAL LOW (ref >0.99)
WBC: 14.1 10*3/uL — ABNORMAL HIGH (ref 3.4–10.8)

## 2020-04-28 LAB — HCV INTERPRETATION

## 2020-05-02 ENCOUNTER — Ambulatory Visit: Payer: Medicaid Other | Admitting: *Deleted

## 2020-05-02 ENCOUNTER — Ambulatory Visit (INDEPENDENT_AMBULATORY_CARE_PROVIDER_SITE_OTHER): Payer: Medicaid Other | Admitting: Women's Health

## 2020-05-02 ENCOUNTER — Other Ambulatory Visit: Payer: Self-pay

## 2020-05-02 ENCOUNTER — Encounter: Payer: Self-pay | Admitting: Women's Health

## 2020-05-02 ENCOUNTER — Other Ambulatory Visit (HOSPITAL_COMMUNITY)
Admission: RE | Admit: 2020-05-02 | Discharge: 2020-05-02 | Disposition: A | Discharge status: Home / Self Care | Payer: Medicaid Other | Source: Ambulatory Visit | Attending: Obstetrics & Gynecology | Admitting: Obstetrics & Gynecology

## 2020-05-02 VITALS — BP 120/87 | HR 105 | Wt 144.5 lb

## 2020-05-02 DIAGNOSIS — Z3483 Encounter for supervision of other normal pregnancy, third trimester: Secondary | ICD-10-CM

## 2020-05-02 DIAGNOSIS — Z3480 Encounter for supervision of other normal pregnancy, unspecified trimester: Secondary | ICD-10-CM | POA: Insufficient documentation

## 2020-05-02 DIAGNOSIS — Z3490 Encounter for supervision of normal pregnancy, unspecified, unspecified trimester: Secondary | ICD-10-CM | POA: Insufficient documentation

## 2020-05-02 DIAGNOSIS — O0933 Supervision of pregnancy with insufficient antenatal care, third trimester: Secondary | ICD-10-CM | POA: Diagnosis not present

## 2020-05-02 DIAGNOSIS — Z23 Encounter for immunization: Secondary | ICD-10-CM

## 2020-05-02 DIAGNOSIS — F418 Other specified anxiety disorders: Secondary | ICD-10-CM

## 2020-05-02 DIAGNOSIS — O0930 Supervision of pregnancy with insufficient antenatal care, unspecified trimester: Secondary | ICD-10-CM | POA: Insufficient documentation

## 2020-05-02 DIAGNOSIS — Z3A35 35 weeks gestation of pregnancy: Secondary | ICD-10-CM | POA: Diagnosis not present

## 2020-05-02 DIAGNOSIS — Z87898 Personal history of other specified conditions: Secondary | ICD-10-CM

## 2020-05-02 DIAGNOSIS — Z8759 Personal history of other complications of pregnancy, childbirth and the puerperium: Secondary | ICD-10-CM

## 2020-05-02 DIAGNOSIS — F1991 Other psychoactive substance use, unspecified, in remission: Secondary | ICD-10-CM

## 2020-05-02 DIAGNOSIS — F17200 Nicotine dependence, unspecified, uncomplicated: Secondary | ICD-10-CM

## 2020-05-02 DIAGNOSIS — Z8679 Personal history of other diseases of the circulatory system: Secondary | ICD-10-CM

## 2020-05-02 MED ORDER — FERROUS SULFATE 325 (65 FE) MG PO TABS: 325.0000 mg | ORAL_TABLET | ORAL | 2 refills | Status: DC

## 2020-05-02 NOTE — Patient Instructions (Addendum)
[name], I greatly value your feedback.  If you receive a survey following your visit with [place] today, we appreciate you taking the time to fill it out.  Thanks, Joellyn Haff, CNM, WHNP-BC  [facility] ([address] [place], [place] [address]) Entrance C, located off of [address] valet parking   Go to [email or web address] to register for FREE online childbirth classes    Call the office [phone]) or go to [facility] if:  You begin to have strong, frequent contractions  Your water breaks.  Sometimes it is a big gush of fluid, sometimes it is just a trickle that keeps getting your panties wet or running down your legs  You have vaginal bleeding.  It is normal to have a small amount of spotting if your cervix was checked.   You don't feel your baby moving like normal.  If you don't, get you something to eat and drink and lay down and focus on feeling your baby move.  You should feel at least 10 movements in 2 hours.  If you don't, you should call the office or go to [facility].   Call the office [phone]) or go to [facility] hospital for these signs of pre-eclampsia:  Severe headache that does not go away with Tylenol  Visual changes- seeing spots, double, blurred vision  Pain under your right breast or upper abdomen that does not go away with Tums or heartburn medicine  Nausea and/or vomiting  Severe swelling in your hands, feet, and face    Home Blood Pressure Monitoring for Patients   Your provider has recommended that you check your blood pressure (BP) at least once a week at home. If you do not have a blood pressure cuff at home, one will be provided for you. Contact your provider if you have not received your monitor within 1 week.   Helpful Tips for Accurate Home Blood Pressure Checks  . Don't smoke, exercise, or drink caffeine 30 minutes before checking your BP . Use the restroom before checking your BP (a full  bladder can raise your pressure) . Relax in a comfortable upright chair . Feet on the ground . Left arm resting comfortably on a flat surface at the level of your heart . Legs uncrossed . Back supported . Sit quietly and don't talk . Place the cuff on your bare arm . Adjust snuggly, so that only two fingertips can fit between your skin and the top of the cuff . Check 2 readings separated by at least one minute . Keep a log of your BP readings . For a visual, please reference this diagram: http://ccnc.care/bpdiagram  Provider Name: [facility]     Phone: [phone]  Zone 1: ALL CLEAR  Continue to monitor your symptoms:  . BP reading is less than 140 (top number) or less than 90 (bottom number)  . No right upper stomach pain . No headaches or seeing spots . No feeling nauseated or throwing up . No swelling in face and hands  Zone 2: CAUTION Call your doctor's office for any of the following:  . BP reading is greater than 140 (top number) or greater than 90 (bottom number)  . Stomach pain under your ribs in the middle or right side . Headaches or seeing spots . Feeling nauseated or throwing up . Swelling in face and hands  Zone 3: EMERGENCY  Seek immediate medical care if you have any of  the following:  . BP reading is greater than160 (top number) or greater than 110 (bottom number) . Severe headaches not improving with Tylenol . Serious difficulty catching your breath . Any worsening symptoms from Zone 2   [place] Pediatricians/Family Doctors:  [facility] [phone]            [facility] [phone]                 [facility] Medicine [phone] (usually not accepting new patients unless you have family there already, you are always welcome to call and ask)       [facility] Department [phone]       [facility] Pediatricians/Family Doctors:   [facility]: [phone]  [facility]: [phone]  [facility]: [phone]  [facility] Doctors:   [facility]: [phone]   [facility]: [phone]  [facility] Doctors:  [facility]: [phone]   Preterm Labor and Birth Information  The normal length of a pregnancy is 39-41 weeks. Preterm labor is when labor starts before 37 completed weeks of pregnancy. What are the risk factors for preterm labor? Preterm labor is more likely to occur in women who:  Have certain infections during pregnancy such as a bladder infection, sexually transmitted infection, or infection inside the uterus (chorioamnionitis).  Have a shorter-than-normal cervix.  Have gone into preterm labor before.  Have had surgery on their cervix.  Are younger than age 52 or older than age 77.  Are African American.  Are pregnant with twins or multiple babies (multiple gestation).  Take street drugs or smoke while pregnant.  Do not gain enough weight while pregnant.  Became pregnant shortly after having been pregnant. What are the symptoms of preterm labor? Symptoms of preterm labor include:  Cramps similar to those that can happen during a menstrual period. The cramps may happen with diarrhea.  Pain in the abdomen or lower back.  Regular uterine contractions that may feel like tightening of the abdomen.  A feeling of increased pressure in the pelvis.  Increased watery or bloody mucus discharge from the vagina.  Water breaking (ruptured amniotic sac). Why is it important to recognize signs of preterm labor? It is important to recognize signs of preterm labor because babies who are born prematurely may not be fully developed. This can put them at an increased risk for:  Long-term (chronic) heart and lung problems.  Difficulty immediately after birth with regulating body systems, including blood sugar, body temperature, heart rate, and  breathing rate.  Bleeding in the brain.  Cerebral palsy.  Learning difficulties.  Death. These risks are highest for babies who are born before 34 weeks of pregnancy. How is preterm labor treated? Treatment depends on the length of your pregnancy, your condition, and the health of your baby. It may involve: 1. Having a stitch (suture) placed in your cervix to prevent your cervix from opening too early (cerclage). 2. Taking or being given medicines, such as: ? Hormone medicines. These may be given early in pregnancy to help support the pregnancy. ? Medicine to stop contractions. ? Medicines to help mature the baby's lungs. These may be prescribed if the risk of delivery is high. ? Medicines to prevent your baby from developing cerebral palsy. If the labor happens before 34 weeks of pregnancy, you may need to stay in the hospital. What should I do if I think I am in preterm labor? If you think that you  are going into preterm labor, call your health care provider right away. How can I prevent preterm labor in future pregnancies? To increase your chance of having a full-term pregnancy:  Do not use any tobacco products, such as cigarettes, chewing tobacco, and e-cigarettes. If you need help quitting, ask your health care provider.  Do not use street drugs or medicines that have not been prescribed to you during your pregnancy.  Talk with your health care provider before taking any herbal supplements, even if you have been taking them regularly.  Make sure you gain a healthy amount of weight during your pregnancy.  Watch for infection. If you think that you might have an infection, get it checked right away.  Make sure to tell your health care provider if you have gone into preterm labor before. This information is not intended to replace advice given to you by your health care provider. Make sure you discuss any questions you have with your health care provider. Document Revised:  05/01/2018 Document Reviewed: 05/31/2015 Elsevier Patient Education  2020 [employer].

## 2020-05-02 NOTE — Progress Notes (Signed)
INITIAL OBSTETRICAL VISIT Patient name: April [name] MRN [id number]  Date of birth: 1980/07/24 Chief Complaint:   Initial Prenatal Visit  History of Present Illness:   April [name] is a 40 y.o. G29P1011 Caucasian female at 108w6d by LMP c/w u/s at 35 weeks with an Estimated Date of Delivery: 05/31/20 being seen today for her initial obstetrical visit.   Her obstetrical history is significant for term uncomplicated SVB 30yrs ago, reports elevated bp's pp- no meds.   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 Some THC, has recently stopped Anx/dep no meds currently Today she reports no complaints.  Depression screen [facility] 2/9 05/02/2020  Decreased Interest 0  Down, Depressed, Hopeless 0  PHQ - 2 Score 0  Altered sleeping 1  Tired, decreased energy 1  Change in appetite 0  Feeling bad or failure about yourself  0  Trouble concentrating 0  Moving slowly or fidgety/restless 0  Suicidal thoughts 0  PHQ-9 Score 2    Patient's last menstrual period was 08/25/2019 (exact date). Last pap unsure. Results were: normal per pt Review of Systems:   Pertinent items are noted in HPI Denies cramping/contractions, leakage of fluid, vaginal bleeding, abnormal vaginal discharge w/ itching/odor/irritation, headaches, visual changes, shortness of breath, chest pain, abdominal pain, severe nausea/vomiting, or problems with urination or bowel movements unless otherwise stated above.  Pertinent History Reviewed:  Reviewed past medical,surgical, social, obstetrical and family history.  Reviewed problem list, medications and allergies. OB History  Gravida Para Term Preterm AB Living  3 1 1   1 1   SAB IAB Ectopic Multiple Live Births  1       1    # Outcome Date GA Lbr Len/2nd Weight Sex Delivery Anes PTL Lv  3 Current           2 SAB 2020 [redacted]w[redacted]d         1 Term 07/26/99 [redacted]w[redacted]d  6 lb 8 oz (2.948 kg) M Vag-Spont None N LIV   Physical Assessment:   Vitals:   05/02/20 0931  BP: 120/87   Pulse: (!) 105  Weight: 144 lb 8 oz (65.5 kg)  Body mass index is 24.8 kg/m.       Physical Examination: by 07/02/20, SNP  General appearance - well appearing, and in no distress  Mental status - alert, oriented to person, place, and time  Psych:  She has a normal mood and affect  Skin - warm and dry, normal color, no suspicious lesions noted  Chest - effort normal, all lung fields clear to auscultation bilaterally  Heart - normal rate and regular rhythm  Abdomen - soft, nontender  Extremities:  No swelling or varicosities noted  Pelvic - VULVA: normal appearing vulva with no masses, tenderness or lesions  VAGINA: normal appearing vagina with normal color and discharge, no lesions  CERVIX: normal appearing cervix without discharge or lesions, no CMT  Thin prep pap is done w/ HR HPV cotesting  Chaperone: me    TODAY'S FH: 34cm   FHR: 131 via doppler  No results found for this or any previous visit (from the past 24 hour(s)).  Assessment & Plan:  1) Low-Risk Pregnancy G3P1011 at [redacted]w[redacted]d with an Estimated Date of Delivery: 05/31/20   2) Initial OB visit  3) Late prenatal care  4) H/O drug use> clean from cocaine and pain pills x17yrs, recently stopped THC  5) Smoker  6) Mild anemia> rx Fe, increase fe-rich foods  7) H/O PPHTN> too late for ASA  8) Fetal isolated LVEICF> on anatomy u/s last week, discussed and gave printed info, doing Panorama today  Meds:  Meds ordered this encounter  Medications  . ferrous sulfate 325 (65 FE) MG tablet    Sig: Take 1 tablet (325 mg total) by mouth every other day.    Dispense:  45 tablet    Refill:  2    Order Specific Question:   Supervising Provider    Answer:   Duane Lope H [[id number]]    Initial labs obtained Continue prenatal vitamins Reviewed n/v relief measures and warning s/s to report Reviewed recommended weight gain based on pre-gravid BMI Encouraged well-balanced diet Genetic & carrier screening discussed: requests  Panorama and Horizon 14 , too late for NT/IT and AFP Ultrasound discussed; fetal survey: requested CCNC completed> form faxed if has or is planning to apply for medicaid The nature of [facility] [employer] with multiple MDs and other Advanced Practice Providers was explained to patient; also emphasized that fellows, residents, and students are part of our team. Does have home bp cuff. Office bp cuff given: no. Check bp weekly, let us know if consistently >140/90.   Follow-up: Return for asap sugar test (no visit), then 1wk LROB w/ CNM in person.   Orders Placed This Encounter  Procedures  . Urine Culture  . Culture, beta strep (group b only)  . Tdap vaccine greater than or equal to 7yo IM  . Genetic Screening  . Pain Management Screening Profile (10S)  . POC Urinalysis Dipstick OB    Cheral Marker CNM, [facility] 05/02/2020 10:49 AM

## 2020-05-04 ENCOUNTER — Other Ambulatory Visit: Payer: Medicaid Other

## 2020-05-04 DIAGNOSIS — Z3480 Encounter for supervision of other normal pregnancy, unspecified trimester: Secondary | ICD-10-CM

## 2020-05-04 DIAGNOSIS — Z131 Encounter for screening for diabetes mellitus: Secondary | ICD-10-CM

## 2020-05-04 DIAGNOSIS — Z3A36 36 weeks gestation of pregnancy: Secondary | ICD-10-CM

## 2020-05-04 LAB — PMP SCREEN PROFILE (10S), URINE
Amphetamine Scrn, Ur: NEGATIVE ng/mL
BARBITURATE SCREEN URINE: NEGATIVE ng/mL
BENZODIAZEPINE SCREEN, URINE: NEGATIVE ng/mL
CANNABINOIDS UR QL SCN: NEGATIVE ng/mL
Cocaine (Metab) Scrn, Ur: NEGATIVE ng/mL
Creatinine(Crt), U: 18.4 mg/dL — ABNORMAL LOW (ref 20.0–300.0)
Methadone Screen, Urine: NEGATIVE ng/mL
OXYCODONE+OXYMORPHONE UR QL SCN: NEGATIVE ng/mL
Opiate Scrn, Ur: NEGATIVE ng/mL
Ph of Urine: 6.8 (ref 4.5–8.9)
Phencyclidine Qn, Ur: NEGATIVE ng/mL
Propoxyphene Scrn, Ur: NEGATIVE ng/mL

## 2020-05-04 LAB — SPECIFIC GRAVITY (REFLEXED): SPECIFIC GRAVITY: 1.0022

## 2020-05-05 LAB — CYTOLOGY - PAP
Chlamydia: NEGATIVE
Comment: NEGATIVE
Comment: NEGATIVE
Comment: NORMAL
Diagnosis: NEGATIVE
High risk HPV: NEGATIVE
Neisseria Gonorrhea: NEGATIVE

## 2020-05-05 LAB — GLUCOSE TOLERANCE, 2 HOURS W/ 1HR
Glucose, 1 hour: 153 mg/dL (ref 65–179)
Glucose, 2 hour: 72 mg/dL (ref 65–152)
Glucose, Fasting: 74 mg/dL (ref 65–91)

## 2020-05-05 LAB — CULTURE, BETA STREP (GROUP B ONLY): Strep Gp B Culture: POSITIVE — AB

## 2020-05-05 LAB — URINE CULTURE

## 2020-05-11 ENCOUNTER — Ambulatory Visit (INDEPENDENT_AMBULATORY_CARE_PROVIDER_SITE_OTHER): Payer: Medicaid Other | Admitting: Obstetrics & Gynecology

## 2020-05-11 ENCOUNTER — Encounter: Payer: Self-pay | Admitting: Obstetrics & Gynecology

## 2020-05-11 ENCOUNTER — Other Ambulatory Visit: Payer: Self-pay

## 2020-05-11 VITALS — BP 116/80 | HR 104 | Wt 144.0 lb

## 2020-05-11 DIAGNOSIS — Z3480 Encounter for supervision of other normal pregnancy, unspecified trimester: Secondary | ICD-10-CM

## 2020-05-11 NOTE — Progress Notes (Signed)
LOW-RISK PREGNANCY VISIT Patient name: April [name] MRN [id number]  Date of birth: Mar 14, 1980 Chief Complaint:   Routine Prenatal Visit  History of Present Illness:   April [name] is a 40 y.o. G31P1011 female at [redacted]w[redacted]d with an Estimated Date of Delivery: 05/31/20 being seen today for ongoing management of a low-risk pregnancy.   -h/o GestHTN- no ASA due to late [facility] starting @ 35wks  Depression screen [facility] 2/9 05/02/2020  Decreased Interest 0  Down, Depressed, Hopeless 0  PHQ - 2 Score 0  Altered sleeping 1  Tired, decreased energy 1  Change in appetite 0  Feeling bad or failure about yourself  0  Trouble concentrating 0  Moving slowly or fidgety/restless 0  Suicidal thoughts 0  PHQ-9 Score 2    Today she reports no complaints. Contractions: Not present.  .  Movement: Present. denies leaking of fluid. Review of Systems:   Pertinent items are noted in HPI Denies abnormal vaginal discharge w/ itching/odor/irritation, headaches, visual changes, shortness of breath, chest pain, abdominal pain, severe nausea/vomiting, or problems with urination or bowel movements unless otherwise stated above. Pertinent History Reviewed:  Reviewed past medical,surgical, social, obstetrical and family history.  Reviewed problem list, medications and allergies.  Physical Assessment:   Vitals:   05/11/20 0947  BP: 116/80  Pulse: (!) 104  Weight: 144 lb (65.3 kg)  Body mass index is 24.72 kg/m.        Physical Examination:   General appearance: Well appearing, and in no distress  Mental status: Alert, oriented to person, place, and time  Skin: Warm & dry  Respiratory: Normal respiratory effort, no distress  Abdomen: Soft, gravid, nontender  Pelvic: Cervical exam deferred         Extremities: Edema: Trace  Psych:  mood and affect appropriate   Fetal Status: Fetal Heart Rate (bpm): 180 Fundal Height: 31 cm Movement: Present    Due to elevated FHR- plan for NST today  Chaperone: n/a     NST being performed due to elevated FHT   Fetal Monitoring:  Prolonged monitoring- initially 180 FHR, pt given water x 2 as she noted having a pepsi this am Baseline: 150 bpm, Variability: moderate, Accelerations: present, The accelerations are >15 bpm and more than 2 in 20 minutes, and Decelerations: Absent   reactive  Final diagnosis:  Reactive NST  No results found for this or any previous visit (from the past 24 hour(s)).   Assessment & Plan:  1) Low-risk pregnancy G3P1011 at [redacted]w[redacted]d with an Estimated Date of Delivery: 05/31/20   2) Elevated FHR- NST completed- reactive Encouraged pt to stay hydrated and avoid caffeine especially prior to visits  3) Small for dates- []  growth scan next available 4) GBS pos- reviewed results, discussed plan for PCN in labor   Meds: No orders of the defined types were placed in this encounter.  Labs/procedures today: BSUS- vertex, anterior placenta, fluid appears subjectively normal, NST  Plan:  Continue routine obstetrical care Next visit: prefers in person    Reviewed: Term labor symptoms and general obstetric precautions including but not limited to vaginal bleeding, contractions, leaking of fluid and fetal movement were reviewed in detail with the patient.  All questions were answered. Pt has home bp cuff. Check bp weekly, let know if >140/90.   Follow-up: Return in about 1 week (around 05/18/2020) for LROB visit, needs growth scan ASAP (Ok to schedule in [place]).  No orders of the defined types were placed in  this encounter.   Myna Hidalgo, DO Attending Obstetrician & Gynecologist, [facility] for [employer], [facility]

## 2020-05-16 ENCOUNTER — Encounter: Payer: Self-pay | Admitting: Women's Health

## 2020-05-16 DIAGNOSIS — Z141 Cystic fibrosis carrier: Secondary | ICD-10-CM | POA: Insufficient documentation

## 2020-05-17 ENCOUNTER — Other Ambulatory Visit: Payer: Self-pay | Admitting: Obstetrics & Gynecology

## 2020-05-17 DIAGNOSIS — O26843 Uterine size-date discrepancy, third trimester: Secondary | ICD-10-CM

## 2020-05-18 ENCOUNTER — Other Ambulatory Visit: Payer: Self-pay

## 2020-05-18 ENCOUNTER — Ambulatory Visit (INDEPENDENT_AMBULATORY_CARE_PROVIDER_SITE_OTHER): Payer: Medicaid Other

## 2020-05-18 ENCOUNTER — Ambulatory Visit (INDEPENDENT_AMBULATORY_CARE_PROVIDER_SITE_OTHER): Payer: Medicaid Other | Admitting: Obstetrics & Gynecology

## 2020-05-18 ENCOUNTER — Encounter: Payer: Self-pay | Admitting: Obstetrics & Gynecology

## 2020-05-18 VITALS — BP 126/83 | HR 101 | Wt 145.0 lb

## 2020-05-18 DIAGNOSIS — O0933 Supervision of pregnancy with insufficient antenatal care, third trimester: Secondary | ICD-10-CM

## 2020-05-18 DIAGNOSIS — O26843 Uterine size-date discrepancy, third trimester: Secondary | ICD-10-CM | POA: Diagnosis not present

## 2020-05-18 DIAGNOSIS — Z3480 Encounter for supervision of other normal pregnancy, unspecified trimester: Secondary | ICD-10-CM

## 2020-05-18 DIAGNOSIS — Z3A38 38 weeks gestation of pregnancy: Secondary | ICD-10-CM

## 2020-05-18 NOTE — Progress Notes (Signed)
   LOW-RISK PREGNANCY VISIT Patient name: April [name] MRN [id number]  Date of birth: [birth year] Chief Complaint:   Routine Prenatal Visit and Pregnancy Ultrasound  History of Present Illness:   April [name] is a 40 y.o. G33P1011 female at [redacted]w[redacted]d with an Estimated Date of Delivery: 05/31/20 being seen today for ongoing management of a low-risk pregnancy.  Depression screen [facility] 2/9 05/02/2020  Decreased Interest 0  Down, Depressed, Hopeless 0  PHQ - 2 Score 0  Altered sleeping 1  Tired, decreased energy 1  Change in appetite 0  Feeling bad or failure about yourself  0  Trouble concentrating 0  Moving slowly or fidgety/restless 0  Suicidal thoughts 0  PHQ-9 Score 2    Today she reports no complaints. Contractions: Not present. Vag. Bleeding: None.  Movement: Present. denies leaking of fluid. Review of Systems:   Pertinent items are noted in HPI Denies abnormal vaginal discharge w/ itching/odor/irritation, headaches, visual changes, shortness of breath, chest pain, abdominal pain, severe nausea/vomiting, or problems with urination or bowel movements unless otherwise stated above. Pertinent History Reviewed:  Reviewed past medical,surgical, social, obstetrical and family history.  Reviewed problem list, medications and allergies. Physical Assessment:   Vitals:   05/18/20 0946  BP: 126/83  Pulse: (!) 101  Weight: 145 lb (65.8 kg)  Body mass index is 24.89 kg/m.        Physical Examination:   General appearance: Well appearing, and in no distress  Mental status: Alert, oriented to person, place, and time  Skin: Warm & dry  Cardiovascular: Normal heart rate noted  Respiratory: Normal respiratory effort, no distress  Abdomen: Soft, gravid, nontender  Pelvic: Cervical exam performed         Extremities: Edema: None  Fetal Status:     Movement: Present    Chaperone: Clint Bolder, RN    No results found for this or any previous visit (from the past 24 hour(s)).  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  Labs/procedures today: sonogram  Plan:  Continue routine obstetrical care  Next visit: prefers in person    Reviewed: Term labor symptoms and general obstetric precautions including but not limited to vaginal bleeding, contractions, leaking of fluid and fetal movement were reviewed in detail with the patient.  All questions were answered. Has home bp cuff. Rx faxed to . Check bp weekly, let us know if >140/90.   Follow-up: Return in about 1 week (around 05/25/2020) for LROB.  No orders of the defined types were placed in this encounter.   Lazaro Arms, MD 05/18/2020 10:41 AM

## 2020-05-18 NOTE — Progress Notes (Signed)
[place] 38+1 wks,cephalic,anterior placenta gr 3,AFI 14 cm,fhr 133 bpm,EFW 2996 g 27%,BPD 1.9%,HC 8.8%

## 2020-05-25 ENCOUNTER — Other Ambulatory Visit: Payer: Self-pay

## 2020-05-25 ENCOUNTER — Ambulatory Visit (INDEPENDENT_AMBULATORY_CARE_PROVIDER_SITE_OTHER): Payer: Medicaid Other | Admitting: Advanced Practice Midwife

## 2020-05-25 VITALS — BP 122/74 | HR 98 | Wt 151.0 lb

## 2020-05-25 DIAGNOSIS — Z3483 Encounter for supervision of other normal pregnancy, third trimester: Secondary | ICD-10-CM

## 2020-05-25 NOTE — Patient Instructions (Signed)

## 2020-05-25 NOTE — Progress Notes (Signed)
   LOW-RISK PREGNANCY VISIT Patient name: April [name] MRN [id number]  Date of birth: 05-Jun-1980 Chief Complaint:   Routine Prenatal Visit  History of Present Illness:   April [name] is a 40 y.o. G21P1011 female at [redacted]w[redacted]d with an Estimated Date of Delivery: 05/31/20 being seen today for ongoing management of a low-risk pregnancy.  Today she reports no complaints. Contractions: Not present. Vag. Bleeding: None.  Movement: Present. denies leaking of fluid. Review of Systems:   Pertinent items are noted in HPI Denies abnormal vaginal discharge w/ itching/odor/irritation, headaches, visual changes, shortness of breath, chest pain, abdominal pain, severe nausea/vomiting, or problems with urination or bowel movements unless otherwise stated above. Pertinent History Reviewed:  Reviewed past medical,surgical, social, obstetrical and family history.  Reviewed problem list, medications and allergies. Physical Assessment:   Vitals:   05/25/20 1518  BP: 122/74  Pulse: 98  Weight: 151 lb (68.5 kg)  Body mass index is 25.92 kg/m.        Physical Examination:   General appearance: Well appearing, and in no distress  Mental status: Alert, oriented to person, place, and time  Skin: Warm & dry  Cardiovascular: Normal heart rate noted  Respiratory: Normal respiratory effort, no distress  Abdomen: Soft, gravid, nontender  Pelvic: Cervical exam deferred         Extremities: Edema: Trace  Fetal Status: Fetal Heart Rate (bpm): 140 Fundal Height: 37 cm Movement: Present    Chaperone: n/a    No results found for this or any previous visit (from the past 24 hour(s)).  Assessment & Plan:  1) Low-risk pregnancy G3P1011 at [redacted]w[redacted]d with an Estimated Date of Delivery: 05/31/20      Meds: No orders of the defined types were placed in this encounter.  Labs/procedures today:   Plan:  Continue routine obstetrical care  Next visit: prefers in person    Reviewed: Term labor symptoms and general obstetric  precautions including but not limited to vaginal bleeding, contractions, leaking of fluid and fetal movement were reviewed in detail with the patient.  All questions were answered. Has home bp cuff. Check bp weekly, let us know if >140/90.   Follow-up: Return in about 1 week (around 06/01/2020) for NST.  No orders of the defined types were placed in this encounter.  Jacklyn Shell DNP, CNM 05/25/2020 3:44 PM

## 2020-05-31 ENCOUNTER — Other Ambulatory Visit: Payer: Self-pay

## 2020-05-31 ENCOUNTER — Encounter (HOSPITAL_COMMUNITY): Payer: Self-pay | Admitting: Obstetrics & Gynecology

## 2020-05-31 ENCOUNTER — Inpatient Hospital Stay (HOSPITAL_COMMUNITY)
Admission: AD | Admit: 2020-05-31 | Discharge: 2020-06-02 | DRG: 807 | Disposition: A | Discharge status: Home / Self Care | Payer: Medicaid Other | Attending: Obstetrics & Gynecology | Admitting: Obstetrics & Gynecology

## 2020-05-31 DIAGNOSIS — O26893 Other specified pregnancy related conditions, third trimester: Secondary | ICD-10-CM | POA: Diagnosis present

## 2020-05-31 DIAGNOSIS — O99824 Streptococcus B carrier state complicating childbirth: Secondary | ICD-10-CM | POA: Diagnosis present

## 2020-05-31 DIAGNOSIS — F329 Major depressive disorder, single episode, unspecified: Secondary | ICD-10-CM | POA: Diagnosis present

## 2020-05-31 DIAGNOSIS — O0930 Supervision of pregnancy with insufficient antenatal care, unspecified trimester: Secondary | ICD-10-CM

## 2020-05-31 DIAGNOSIS — Z141 Cystic fibrosis carrier: Secondary | ICD-10-CM

## 2020-05-31 DIAGNOSIS — O99334 Smoking (tobacco) complicating childbirth: Secondary | ICD-10-CM | POA: Diagnosis present

## 2020-05-31 DIAGNOSIS — Z3493 Encounter for supervision of normal pregnancy, unspecified, third trimester: Secondary | ICD-10-CM

## 2020-05-31 DIAGNOSIS — O09523 Supervision of elderly multigravida, third trimester: Secondary | ICD-10-CM | POA: Diagnosis not present

## 2020-05-31 DIAGNOSIS — O165 Unspecified maternal hypertension, complicating the puerperium: Secondary | ICD-10-CM

## 2020-05-31 DIAGNOSIS — F1991 Other psychoactive substance use, unspecified, in remission: Secondary | ICD-10-CM

## 2020-05-31 DIAGNOSIS — F17200 Nicotine dependence, unspecified, uncomplicated: Secondary | ICD-10-CM | POA: Diagnosis present

## 2020-05-31 DIAGNOSIS — F418 Other specified anxiety disorders: Secondary | ICD-10-CM | POA: Diagnosis present

## 2020-05-31 DIAGNOSIS — O26892 Other specified pregnancy related conditions, second trimester: Secondary | ICD-10-CM | POA: Diagnosis not present

## 2020-05-31 DIAGNOSIS — Z20822 Contact with and (suspected) exposure to covid-19: Secondary | ICD-10-CM | POA: Diagnosis present

## 2020-05-31 DIAGNOSIS — F17210 Nicotine dependence, cigarettes, uncomplicated: Secondary | ICD-10-CM | POA: Diagnosis present

## 2020-05-31 DIAGNOSIS — R030 Elevated blood-pressure reading, without diagnosis of hypertension: Secondary | ICD-10-CM | POA: Diagnosis not present

## 2020-05-31 DIAGNOSIS — O134 Gestational [pregnancy-induced] hypertension without significant proteinuria, complicating childbirth: Principal | ICD-10-CM | POA: Diagnosis present

## 2020-05-31 DIAGNOSIS — Z3A40 40 weeks gestation of pregnancy: Secondary | ICD-10-CM | POA: Diagnosis not present

## 2020-05-31 DIAGNOSIS — Z8679 Personal history of other diseases of the circulatory system: Secondary | ICD-10-CM

## 2020-05-31 DIAGNOSIS — O139 Gestational [pregnancy-induced] hypertension without significant proteinuria, unspecified trimester: Secondary | ICD-10-CM

## 2020-05-31 DIAGNOSIS — Z23 Encounter for immunization: Secondary | ICD-10-CM | POA: Diagnosis not present

## 2020-05-31 DIAGNOSIS — Z2839 Other underimmunization status: Secondary | ICD-10-CM

## 2020-05-31 DIAGNOSIS — Z720 Tobacco use: Secondary | ICD-10-CM | POA: Diagnosis not present

## 2020-05-31 DIAGNOSIS — O09899 Supervision of other high risk pregnancies, unspecified trimester: Secondary | ICD-10-CM

## 2020-05-31 DIAGNOSIS — O0933 Supervision of pregnancy with insufficient antenatal care, third trimester: Secondary | ICD-10-CM

## 2020-05-31 DIAGNOSIS — O135 Gestational [pregnancy-induced] hypertension without significant proteinuria, complicating the puerperium: Secondary | ICD-10-CM | POA: Diagnosis not present

## 2020-05-31 DIAGNOSIS — O480 Post-term pregnancy: Secondary | ICD-10-CM | POA: Diagnosis not present

## 2020-05-31 DIAGNOSIS — Z87898 Personal history of other specified conditions: Secondary | ICD-10-CM

## 2020-05-31 LAB — CBC
HCT: 36.7 % (ref 36.0–46.0)
Hemoglobin: 12.2 g/dL (ref 12.0–15.0)
MCH: 31.4 pg (ref 26.0–34.0)
MCHC: 33.2 g/dL (ref 30.0–36.0)
MCV: 94.6 fL (ref 80.0–100.0)
Platelets: 240 10*3/uL (ref 150–400)
RBC: 3.88 MIL/uL (ref 3.87–5.11)
RDW: 15.6 % — ABNORMAL HIGH (ref 11.5–15.5)
WBC: 13.9 10*3/uL — ABNORMAL HIGH (ref 4.0–10.5)
nRBC: 0 % (ref 0.0–0.2)

## 2020-05-31 LAB — COMPREHENSIVE METABOLIC PANEL
ALT: 14 U/L (ref 0–44)
AST: 17 U/L (ref 15–41)
Albumin: 2.8 g/dL — ABNORMAL LOW (ref 3.5–5.0)
Alkaline Phosphatase: 148 U/L — ABNORMAL HIGH (ref 38–126)
Anion gap: 9 (ref 5–15)
BUN: 6 mg/dL (ref 6–20)
CO2: 20 mmol/L — ABNORMAL LOW (ref 22–32)
Calcium: 8.8 mg/dL — ABNORMAL LOW (ref 8.9–10.3)
Chloride: 107 mmol/L (ref 98–111)
Creatinine, Ser: 0.5 mg/dL (ref 0.44–1.00)
GFR, Estimated: >60 mL/min (ref >60)
Glucose, Bld: 96 mg/dL (ref 70–99)
Potassium: 3.6 mmol/L (ref 3.5–5.1)
Sodium: 136 mmol/L (ref 135–145)
Total Bilirubin: 0.8 mg/dL (ref 0.3–1.2)
Total Protein: 6 g/dL — ABNORMAL LOW (ref 6.5–8.1)

## 2020-05-31 LAB — RESP PANEL BY RT-PCR (FLU A&B, COVID) ARPGX2
Influenza A by PCR: NEGATIVE
Influenza B by PCR: NEGATIVE
SARS Coronavirus 2 by RT PCR: NEGATIVE

## 2020-05-31 LAB — PROTEIN / CREATININE RATIO, URINE
Creatinine, Urine: 44.61 mg/dL
Protein Creatinine Ratio: 0.13 mg/mg{Cre} (ref 0.00–0.15)
Total Protein, Urine: 6 mg/dL

## 2020-05-31 LAB — RPR: RPR Ser Ql: NONREACTIVE

## 2020-05-31 LAB — RAPID URINE DRUG SCREEN, HOSP PERFORMED
Amphetamines: NOT DETECTED
Barbiturates: NOT DETECTED
Benzodiazepines: NOT DETECTED
Cocaine: NOT DETECTED
Opiates: NOT DETECTED
Tetrahydrocannabinol: NOT DETECTED

## 2020-05-31 LAB — AMNISURE RUPTURE OF MEMBRANE (ROM) NOT AT ARMC: Amnisure ROM: NEGATIVE

## 2020-05-31 LAB — TYPE AND SCREEN
ABO/RH(D): O POS
Antibody Screen: NEGATIVE

## 2020-05-31 LAB — POCT FERN TEST: POCT Fern Test: NEGATIVE

## 2020-05-31 MED ORDER — OXYCODONE-ACETAMINOPHEN 5-325 MG PO TABS
1.0000 | ORAL_TABLET | ORAL | Status: DC | PRN
  Administered 2020-05-31: 1 via ORAL
  Filled 2020-05-31: qty 1

## 2020-05-31 MED ORDER — ACETAMINOPHEN 325 MG PO TABS: 650.0000 mg | ORAL_TABLET | ORAL | Status: DC | PRN

## 2020-05-31 MED ORDER — ONDANSETRON HCL 4 MG/2ML IJ SOLN: 4.0000 mg | Freq: Four times a day (QID) | INTRAMUSCULAR | Status: DC | PRN

## 2020-05-31 MED ORDER — EPHEDRINE 5 MG/ML INJ: 10.0000 mg | INTRAVENOUS | Status: DC | PRN

## 2020-05-31 MED ORDER — PHENYLEPHRINE 40 MCG/ML (10ML) SYRINGE FOR IV PUSH (FOR BLOOD PRESSURE SUPPORT): 80.0000 ug | PREFILLED_SYRINGE | INTRAVENOUS | Status: DC | PRN

## 2020-05-31 MED ORDER — SENNOSIDES-DOCUSATE SODIUM 8.6-50 MG PO TABS
2.0000 | ORAL_TABLET | Freq: Every day | ORAL | Status: DC
  Administered 2020-06-01 – 2020-06-02 (×2): 2 via ORAL
  Filled 2020-05-31 (×2): qty 2

## 2020-05-31 MED ORDER — OXYTOCIN-SODIUM CHLORIDE 30-0.9 UT/500ML-% IV SOLN
2.5000 [IU]/h | INTRAVENOUS | Status: DC
  Filled 2020-05-31: qty 500

## 2020-05-31 MED ORDER — LACTATED RINGERS IV SOLN: 500.0000 mL | INTRAVENOUS | Status: DC | PRN

## 2020-05-31 MED ORDER — TETANUS-DIPHTH-ACELL PERTUSSIS 5-2.5-18.5 LF-MCG/0.5 IM SUSY: 0.5000 mL | PREFILLED_SYRINGE | Freq: Once | INTRAMUSCULAR | Status: DC

## 2020-05-31 MED ORDER — LIDOCAINE HCL (PF) 1 % IJ SOLN: 30.0000 mL | INTRAMUSCULAR | Status: DC | PRN

## 2020-05-31 MED ORDER — PRENATAL MULTIVITAMIN CH
1.0000 | ORAL_TABLET | Freq: Every day | ORAL | Status: DC
  Administered 2020-05-31 – 2020-06-01 (×2): 1 via ORAL
  Filled 2020-05-31 (×2): qty 1

## 2020-05-31 MED ORDER — OXYCODONE-ACETAMINOPHEN 5-325 MG PO TABS: 2.0000 | ORAL_TABLET | ORAL | Status: DC | PRN

## 2020-05-31 MED ORDER — FENTANYL CITRATE (PF) 100 MCG/2ML IJ SOLN
100.0000 ug | INTRAMUSCULAR | Status: DC | PRN
  Administered 2020-05-31: 100 ug via INTRAVENOUS
  Filled 2020-05-31 (×2): qty 2

## 2020-05-31 MED ORDER — ONDANSETRON HCL 4 MG PO TABS: 4.0000 mg | ORAL_TABLET | ORAL | Status: DC | PRN

## 2020-05-31 MED ORDER — DIBUCAINE (PERIANAL) 1 % EX OINT
1.0000 | TOPICAL_OINTMENT | CUTANEOUS | Status: DC | PRN
Start: 2020-05-31 — End: 2020-06-02

## 2020-05-31 MED ORDER — DIPHENHYDRAMINE HCL 25 MG PO CAPS: 25.0000 mg | ORAL_CAPSULE | Freq: Four times a day (QID) | ORAL | Status: DC | PRN

## 2020-05-31 MED ORDER — MEASLES, MUMPS & RUBELLA VAC IJ SOLR
0.5000 mL | Freq: Once | INTRAMUSCULAR | Status: AC
  Administered 2020-06-02: 0.5 mL via SUBCUTANEOUS
  Filled 2020-05-31: qty 0.5

## 2020-05-31 MED ORDER — NICOTINE 7 MG/24HR TD PT24
7.0000 mg | MEDICATED_PATCH | Freq: Every day | TRANSDERMAL | Status: DC
  Administered 2020-06-01: 7 mg via TRANSDERMAL
  Filled 2020-05-31 (×2): qty 1

## 2020-05-31 MED ORDER — LACTATED RINGERS IV SOLN: 500.0000 mL | Freq: Once | INTRAVENOUS | Status: DC

## 2020-05-31 MED ORDER — OXYTOCIN BOLUS FROM INFUSION
333.0000 mL | Freq: Once | INTRAVENOUS | Status: AC
  Administered 2020-05-31: 333 mL via INTRAVENOUS

## 2020-05-31 MED ORDER — IBUPROFEN 600 MG PO TABS
600.0000 mg | ORAL_TABLET | Freq: Four times a day (QID) | ORAL | Status: DC
  Administered 2020-05-31 – 2020-06-02 (×8): 600 mg via ORAL
  Filled 2020-05-31 (×8): qty 1

## 2020-05-31 MED ORDER — SIMETHICONE 80 MG PO CHEW: 80.0000 mg | CHEWABLE_TABLET | ORAL | Status: DC | PRN

## 2020-05-31 MED ORDER — WITCH HAZEL-GLYCERIN EX PADS: 1.0000 "application " | MEDICATED_PAD | CUTANEOUS | Status: DC | PRN

## 2020-05-31 MED ORDER — SODIUM CHLORIDE 0.9 % IV SOLN
5.0000 10*6.[IU] | Freq: Once | INTRAVENOUS | Status: AC
  Administered 2020-05-31: 5 10*6.[IU] via INTRAVENOUS
  Filled 2020-05-31: qty 5

## 2020-05-31 MED ORDER — PENICILLIN G POT IN DEXTROSE 60000 UNIT/ML IV SOLN: 3.0000 10*6.[IU] | INTRAVENOUS | Status: DC

## 2020-05-31 MED ORDER — BENZOCAINE-MENTHOL 20-0.5 % EX AERO
1.0000 "application " | INHALATION_SPRAY | CUTANEOUS | Status: DC | PRN
  Administered 2020-05-31: 1 via TOPICAL
  Filled 2020-05-31: qty 56

## 2020-05-31 MED ORDER — COCONUT OIL OIL
1.0000 "application " | TOPICAL_OIL | Status: DC | PRN
  Administered 2020-05-31: 1 via TOPICAL

## 2020-05-31 MED ORDER — NICOTINE POLACRILEX 2 MG MT GUM
2.0000 mg | CHEWING_GUM | OROMUCOSAL | Status: DC | PRN
  Administered 2020-05-31 – 2020-06-01 (×2): 2 mg via ORAL
  Filled 2020-05-31 (×3): qty 1

## 2020-05-31 MED ORDER — DIPHENHYDRAMINE HCL 50 MG/ML IJ SOLN: 12.5000 mg | INTRAMUSCULAR | Status: DC | PRN

## 2020-05-31 MED ORDER — LACTATED RINGERS IV SOLN: INTRAVENOUS | Status: DC

## 2020-05-31 MED ORDER — FENTANYL CITRATE (PF) 100 MCG/2ML IJ SOLN: 100.0000 ug | Freq: Once | INTRAMUSCULAR | Status: DC

## 2020-05-31 MED ORDER — ONDANSETRON HCL 4 MG/2ML IJ SOLN
4.0000 mg | INTRAMUSCULAR | Status: DC | PRN
Start: 2020-05-31 — End: 2020-06-02

## 2020-05-31 MED ORDER — ACETAMINOPHEN 325 MG PO TABS
650.0000 mg | ORAL_TABLET | Freq: Four times a day (QID) | ORAL | Status: DC
  Administered 2020-05-31 – 2020-06-02 (×7): 650 mg via ORAL
  Filled 2020-05-31 (×7): qty 2

## 2020-05-31 MED ORDER — SOD CITRATE-CITRIC ACID 500-334 MG/5ML PO SOLN: 30.0000 mL | ORAL | Status: DC | PRN

## 2020-05-31 MED ORDER — FENTANYL-BUPIVACAINE-NACL 0.5-0.125-0.9 MG/250ML-% EP SOLN: 12.0000 mL/h | EPIDURAL | Status: DC | PRN

## 2020-05-31 NOTE — Discharge Summary (Signed)
  Postpartum Discharge Summary  Date of Service updated     Patient Name: April [name] DOB: 06/13/1980 MRN: [id number]  Date of admission: 05/31/2020 Delivery date:05/31/2020  Delivering provider: GOSWICK, ANNA E  Date of discharge: 06/02/2020  Admitting diagnosis: Supervision of low-risk pregnancy, third trimester [Z34.93] Intrauterine pregnancy: [redacted]w[redacted]d     Secondary diagnosis:  Principal Problem:   Vaginal delivery Active Problems:   MDD (major depressive disorder)   Late prenatal care in third trimester   History of drug use   History of postpartum hypertension   Depression with anxiety   Smoker   Cystic fibrosis carrier   Supervision of low-risk pregnancy, third trimester   Rubella non-immune status, antepartum   Gestational hypertension  Additional problems: as noted above  Discharge diagnosis: Term Pregnancy Delivered                                              Post partum procedures:NA Augmentation: none Complications: Precipitous Delivery  Hospital course: Onset of Labor With Vaginal Delivery      40 y.o. yo G3P1011 at [redacted]w[redacted]d was admitted in Latent Labor on 05/31/2020. Patient had a precipitous delivery; cervical exam changed from 1.5cm to 10cm s/p 1 dose of fentanyl. Labor course as follows:  Membrane Rupture Time/Date: 5:20 AM ,05/31/2020   Delivery Method:Vaginal, Spontaneous  Episiotomy: None  Lacerations:  None  Patient had an uncomplicated postpartum course. She is ambulating, tolerating a regular diet, passing flatus, and urinating well. Patient is discharged home in stable condition on 06/02/20. Given new diagnosis of gestational hypertension with persistent mild range blood pressures s/p delivery, pt was discharged on procardia 30mg daily with plan for 1 week blood pressure check in clinic.  Newborn Data: Birth date:05/31/2020  Birth time:5:26 AM  Gender:Female  Living status:Living  Apgars:3 ,9  Weight:2671 g   Magnesium Sulfate received: No BMZ  received: No Rhophylac:N/A MMR:N/A offered prior to discharge T-DaP:Given prenatally Flu: offered prior to discharge Transfusion:No  Physical exam  Vitals:   06/01/20 0540 06/01/20 1358 06/01/20 2039 06/02/20 0518  BP: 137/83 121/87 120/76 111/78  Pulse: 78 82 85 73  Resp: 17 20 20 18  Temp: 98.4 F (36.9 C) 98 F (36.7 C) 98.3 F (36.8 C) 98.3 F (36.8 C)  TempSrc: Oral Oral Oral Oral  SpO2: 100% 99% 98% 100%  Weight:      Height:       General: alert, cooperative and no distress Lochia: appropriate Uterine Fundus: firm Incision: N/A DVT Evaluation: No evidence of DVT seen on physical exam. No cords or calf tenderness. No significant calf/ankle edema. Labs: Lab Results  Component Value Date   WBC 13.9 (H) 05/31/2020   HGB 12.2 05/31/2020   HCT 36.7 05/31/2020   MCV 94.6 05/31/2020   PLT 240 05/31/2020   CMP Latest Ref Rng & Units 05/31/2020  Glucose 70 - 99 mg/dL 96  BUN 6 - 20 mg/dL 6  Creatinine 0.44 - 1.00 mg/dL 0.50  Sodium 135 - 145 mmol/L 136  Potassium 3.5 - 5.1 mmol/L 3.6  Chloride 98 - 111 mmol/L 107  CO2 22 - 32 mmol/L 20(L)  Calcium 8.9 - 10.3 mg/dL 8.8(L)  Total Protein 6.5 - 8.1 g/dL 6.0(L)  Total Bilirubin 0.3 - 1.2 mg/dL 0.8  Alkaline Phos 38 - 126 U/L 148(H)  AST 15 - 41 U/L 17    ALT 0 - 44 U/L 14   Edinburgh Score: Edinburgh Postnatal Depression Scale Screening Tool 05/31/2020  I have been able to laugh and see the funny side of things. 0  I have looked forward with enjoyment to things. 0  I have blamed myself unnecessarily when things went wrong. 1  I have been anxious or worried for no good reason. 1  I have felt scared or panicky for no good reason. 0  Things have been getting on top of me. 0  I have been so unhappy that I have had difficulty sleeping. 0  I have felt sad or miserable. 0  I have been so unhappy that I have been crying. 0  The thought of harming myself has occurred to me. 0  Edinburgh Postnatal Depression Scale Total 2      After visit meds:  Allergies as of 06/02/2020      Reactions   Other Hives   Cashews      Medication List    STOP taking these medications   ferrous sulfate 325 (65 FE) MG tablet     TAKE these medications   acetaminophen 325 MG tablet Commonly known as: Tylenol Take 2 tablets (650 mg total) by mouth every 6 (six) hours as needed for mild pain, moderate pain, fever or headache.   coconut oil Oil Apply 1 application topically as needed (nipple pain).   ibuprofen 600 MG tablet Commonly known as: ADVIL Take 1 tablet (600 mg total) by mouth every 8 (eight) hours as needed for moderate pain or cramping.   nicotine 7 mg/24hr patch Commonly known as: NICODERM CQ - dosed in mg/24 hr Place 1 patch (7 mg total) onto the skin at bedtime.   nicotine polacrilex 2 MG gum Commonly known as: NICORETTE Take 1 each (2 mg total) by mouth as needed for smoking cessation.   prenatal multivitamin Tabs tablet Take 1 tablet by mouth daily at 12 noon.        Discharge home in stable condition Infant Feeding: Breast Infant Disposition:home with mother Discharge instruction: per After Visit Summary and Postpartum booklet. Activity: Advance as tolerated. Pelvic rest for 6 weeks.  Diet: routine diet Future Appointments: Future Appointments  Date Time Provider [place]  06/07/2020 10:30 AM Chancy Milroy, MD [facility]  07/05/2020  1:30 PM Myrtis Ser, CNM [facility]   Follow up Visit: Message sent to [facility] by Dr. Astrid Drafts.  Please schedule this patient for a In person postpartum visit in 6 weeks with the following provider: Any provider. Additional Postpartum F/U:Postpartum Depression checkup and BP check 1 week  Low risk pregnancy complicated by: Elevated blood pressure on admission to L&D, h/o drug use, Depression, Tobacco Use, Rubella non-immune Delivery mode:  Vaginal, Spontaneous  Anticipated Birth Control:  plan for outpatient IUD   Patient  doing well; interacting with baby in bed.  Per Dr. Rip Harbour on 5/13; will d/c procardia and have patient with BP check in one week.   [place]arr [place], [place]  06/02/2020 10:52 AM

## 2020-05-31 NOTE — Lactation Note (Signed)
This note was copied from a baby's chart. Lactation Consultation Note Baby less than 1 hr old at time of delivery. Mom was BF when LC entered rm. Praised mom. Mom BF her 39 yr old for 2 weeks. Mom had trouble latching her first child. Mom is excited that baby is BF so well. Mom had good body alignment.  Mom has short shaft everted compressible nipples. Hand expression demonstrated because wanted to know if she had anything for the baby to eat. Colostrum expressed. Praised mom. Mom will be F/U on MBU.  Patient Name: April [name] [id number] Date: 05/31/2020 Reason for consult: L&D Initial assessment;Term Age:65 hours  Maternal Data    Feeding    LATCH Score Latch: Grasps breast easily, tongue down, lips flanged, rhythmical sucking.  Audible Swallowing: A few with stimulation  Type of Nipple: Everted at rest and after stimulation  Comfort (Breast/Nipple): Soft / non-tender  Hold (Positioning): No assistance needed to correctly position infant at breast.  LATCH Score: 9   Lactation Tools Discussed/Used    Interventions Interventions: Skin to skin;Breast massage;Hand express  Discharge    Consult Status Consult Status: Follow-up Date: 05/31/20 Follow-up type: In-patient    April [name], April [name] 05/31/2020, 6:35 AM

## 2020-05-31 NOTE — Discharge Instructions (Signed)

## 2020-05-31 NOTE — MAU Note (Addendum)
Have had irregular ctxs since Monday night. For past 2-3 hrs ctxs have been about 4-24mins apart. Denies VB. Has noticed underwear alittle wet since Monday night but usually after urinating which has been more than usual. States was told last sve that cervix was "very tight"

## 2020-05-31 NOTE — H&P (Signed)
OBSTETRIC ADMISSION HISTORY AND PHYSICAL  April [name] is a 40 y.o. female G3P1011 with IUP at [redacted]w[redacted]d by L/34 presenting for concern of contractions. She reports +FMs, No LOF, no VB, no blurry vision, headaches or peripheral edema, and RUQ pain.  She plans on breast feeding. She requests outpatient IUD for birth control.  She received her prenatal care at [facility]   Dating: By L/34 --->  Estimated Date of Delivery: 05/31/20  Sono:  @[redacted]w[redacted]d , CWD, normal anatomy, cephalic presentation, 2498g, EFW  Prenatal History/Complications:  - late prenatal care - h/o polysubstance use (cocaine, narcotics) - major depressive disorder (no medications in pregnancy) - h/o postpartum hypertension - tobacco use - cystic fibrosis carrier - rubella non-immune - AMA  Past Medical History: Past Medical History:  Diagnosis Date  . Anxiety   . Bronchitis   . Depression   . Miscarriage     Past Surgical History: Past Surgical History:  Procedure Laterality Date  . ANKLE SURGERY     left    Obstetrical History: OB History    Gravida  3   Para  1   Term  1   Preterm      AB  1   Living  1     SAB  1   IAB      Ectopic      Multiple      Live Births  1           Social History Social History   Socioeconomic History  . Marital status: Single    Spouse name: Not on file  . Number of children: Not on file  . Years of education: Not on file  . Highest education level: Not on file  Occupational History  . Not on file  Tobacco Use  . Smoking status: Current Every Day Smoker    Packs/day: 0.50    Years: 12.00    Pack years: 6.00    Types: Cigarettes  . Smokeless tobacco: Never Used  Vaping Use  . Vaping Use: Never used  Substance and Sexual Activity  . Alcohol use: Not Currently    Alcohol/week: 11.0 standard drinks    Types: 11 Cans of beer per week  . Drug use: Not Currently    Types: Cocaine    Comment: last use 12/21/18  . Sexual activity: Yes     Birth control/protection: None  Other Topics Concern  . Not on file  Social History Narrative  . Not on file   Social Determinants of Health   Financial Resource Strain: Low Risk   . Difficulty of Paying Living Expenses: Not hard at all  Food Insecurity: No Food Insecurity  . Worried About 12/23/18 in the Last Year: Never true  . Ran Out of Food in the Last Year: Never true  Transportation Needs: No Transportation Needs  . Lack of Transportation (Medical): No  . Lack of Transportation (Non-Medical): No  Physical Activity: Insufficiently Active  . Days of Exercise per Week: 2 days  . Minutes of Exercise per Session: 20 min  Stress: No Stress Concern Present  . Feeling of Stress : Only a little  Social Connections: Moderately Isolated  . Frequency of Communication with Friends and Family: More than three times a week  . Frequency of Social Gatherings with Friends and Family: Twice a week  . Attends Religious Services: Never  . Active Member of Clubs or Organizations: No  . Attends Programme researcher, broadcasting/film/video  Meetings: Never  . Marital Status: Living with partner    Family History: Family History  Problem Relation Age of Onset  . Hypertension Mother   . Gout Mother   . Diabetes Other   . Cancer Other   . Hypertension Other   . Cancer Maternal Aunt   . Hypertension Maternal Grandmother   . Cancer Maternal Grandmother        ovarian, pancreatic  . Cancer Maternal Grandfather        lung  . Mental retardation Cousin     Allergies: Allergies  Allergen Reactions  . Other Hives    Cashews    Medications Prior to Admission  Medication Sig Dispense Refill Last Dose  . ferrous sulfate 325 (65 FE) MG tablet Take 1 tablet (325 mg total) by mouth every other day. 45 tablet 2 05/30/2020 at Unknown time  . Prenatal Vit-Fe Fumarate-FA (PRENATAL MULTIVITAMIN) TABS tablet Take 1 tablet by mouth daily at 12 noon.   05/30/2020 at Unknown time     Review of Systems   All  systems reviewed and negative except as stated in HPI  Blood pressure 131/69, pulse 86, temperature 98.1 F (36.7 C), resp. rate 16, height 5\' 4"  (1.626 m), weight 66.7 kg, last menstrual period 08/25/2019, SpO2 100 %. General appearance: alert, cooperative and appears stated age Lungs: normal WOB Heart: regular rate Abdomen: soft, non-tender Extremities:no sign of DVT Presentation: cephalic Fetal monitoringBaseline: 130 bpm, Variability: Good {> 6 bpm), Accelerations: Reactive and Decelerations: Absent Uterine activityFrequency: Every 3 minutes Dilation: 1.5 Effacement (%): 70 Station: -2 Exam by:: [email or web address], RN   Prenatal labs: ABO, Rh: O/Positive/-- (04/07 1618) Antibody: Negative (04/07 1618) Rubella: <0.90 (04/07 1618) RPR: Non Reactive (04/07 1618)  HBsAg: Negative (04/07 1618)  HIV: Non Reactive (04/07 1618)  GBS: Positive/-- (04/12 1400)  2 hr Glucola wnl Genetic screening: too late, but known maternal CF carrier Anatomy [birth year]1974 wnl except for echogenic cardiac focus  Prenatal Transfer Tool  Maternal Diabetes: No Genetic Screening: too late, but known maternal CF carrier Maternal Ultrasounds/Referrals: Isolated EIF (echogenic intracardiac focus) Fetal Ultrasounds or other Referrals:  None Maternal Substance Abuse:  Yes:  Type: Smoker (remote cocaine, narcotics) Significant Maternal Medications:  None Significant Maternal Lab Results: Group B Strep positive  Results for orders placed or performed during the hospital encounter of 05/31/20 (from the past 24 hour(s))  Urine rapid drug screen (hosp performed)   Collection Time: 05/31/20  2:10 AM  Result Value Ref Range   Opiates NONE DETECTED NONE DETECTED   Cocaine NONE DETECTED NONE DETECTED   Benzodiazepines NONE DETECTED NONE DETECTED   Amphetamines NONE DETECTED NONE DETECTED   Tetrahydrocannabinol NONE DETECTED NONE DETECTED   Barbiturates NONE DETECTED NONE DETECTED  Protein / creatinine ratio, urine   Collection  Time: 05/31/20  2:10 AM  Result Value Ref Range   Creatinine, Urine 44.61 mg/dL   Total Protein, Urine 6 mg/dL   Protein Creatinine Ratio 0.13 0.00 - 0.15 mg/mg[Cre]  POCT fern test   Collection Time: 05/31/20  2:53 AM  Result Value Ref Range   POCT Fern Test Negative = intact amniotic membranes   Amnisure rupture of membrane (rom)not at [facility]   Collection Time: 05/31/20  3:07 AM  Result Value Ref Range   Amnisure ROM NEGATIVE     Patient Active Problem List   Diagnosis Date Noted  . Cystic fibrosis carrier 05/16/2020  . Encounter for supervision of normal pregnancy, antepartum 05/02/2020  . Late  prenatal care in third trimester 05/02/2020  . History of drug use 05/02/2020  . History of postpartum hypertension 05/02/2020  . Depression with anxiety 05/02/2020  . Smoker 05/02/2020  . Cocaine dependence with cocaine-induced mood disorder (HCC)   . MDD (major depressive disorder) 09/02/2018    Assessment/Plan:  April [name] is a 40 y.o. G3P1011 at [redacted]w[redacted]d here in latent labor in the setting of AMA and elevated blood pressure without prior diagnosis.  #Latent Labor  AMA: Will manage expectantly and augment as clinically indicated. #Pain: TBD per pt preference #FWB: Category 1 strip #ID: GBS+ >PCN ordered on admission #MOF: breast #MOC: outpatient IUD #Circ: n/a #Elevated BP  H/o PP Hypertension: pt with elevated blood pressure in MAU on arrival. Pt asymptomatic. F/u preeclampsia labs on admission. #H/o Polysubstance Use  Late PNC: negative UDS on admission. Plan for SW consult in postpartum period. #MDD: no current medications. No current safety or mood concerns. SW consult as noted above. #Tobacco Use: pt declined nicotine patch on admission  Sheila Oats, MD OB Fellow, Faculty Practice 05/31/2020 4:16 AM

## 2020-06-01 ENCOUNTER — Other Ambulatory Visit: Payer: Medicaid Other

## 2020-06-01 DIAGNOSIS — O165 Unspecified maternal hypertension, complicating the puerperium: Secondary | ICD-10-CM

## 2020-06-01 DIAGNOSIS — Z720 Tobacco use: Secondary | ICD-10-CM

## 2020-06-01 DIAGNOSIS — O139 Gestational [pregnancy-induced] hypertension without significant proteinuria, unspecified trimester: Secondary | ICD-10-CM

## 2020-06-01 DIAGNOSIS — O135 Gestational [pregnancy-induced] hypertension without significant proteinuria, complicating the puerperium: Secondary | ICD-10-CM

## 2020-06-01 LAB — SURGICAL PATHOLOGY

## 2020-06-01 MED ORDER — NIFEDIPINE ER OSMOTIC RELEASE 30 MG PO TB24
30.0000 mg | ORAL_TABLET | Freq: Every day | ORAL | Status: DC
  Administered 2020-06-01 – 2020-06-02 (×2): 30 mg via ORAL
  Filled 2020-06-01 (×2): qty 1

## 2020-06-01 NOTE — Progress Notes (Addendum)
Post Partum Day 1 Subjective: Patient is doing well without complaints. Ambulating without difficulty. Voiding and passing flatus. Tolerating PO. Abdominal pain improved. Vaginal bleeding decreased. The patient had an elevated blood pressure of 143/87 during the postpartum period and subsequent elevated diastolic pressures. No vision changes or headache.    Objective: Blood pressure 137/83, pulse 78, temperature 98.4 F (36.9 C), temperature source Oral, resp. rate 17, height 5\' 4"  (1.626 m), weight 66.7 kg, last menstrual period 08/25/2019, SpO2 100 %, unknown if currently breastfeeding.  Physical Exam:  General: alert, cooperative and no distress Lochia: appropriate Uterine Fundus: firm Incision: N/A DVT Evaluation: No evidence of DVT seen on physical exam.  Recent Labs    05/31/20 0353  HGB 12.2  HCT 36.7    Assessment/Plan: Pt is a 39yoF G3P2012 who is PPD#1 s/p precipitous vaginal delivery. Doing well overall. Feeding: breast Contraception: plan for outpatient IUD GHTN: start procardia 30mg  daily. Pt asymptomatic. Will continue to monitor. Late PNC  Remote H/o polysubstance use: plan for SW consult prior to discharge Tobacco Use Disorder: nicotine patch daily, prn gum  Discharge: plan for discharge PPD#2 given baby needs to stay (inadequate antibiotics for GBS+ status)   LOS: 1 day   07/31/20 06/01/2020, 7:23 AM   Attestation of Supervision of Student:  I confirm that I have verified the information documented in the physician assistant student's note and that I have also personally reperformed the history, physical exam and all medical decision making activities.  I have verified that all services and findings are accurately documented in this student's note; and I agree with management and plan as outlined in the documentation. I have also made any necessary editorial changes.  Alda Ponder, MD [facility], [facility] 06/01/2020 10:01 AM

## 2020-06-01 NOTE — Clinical Social Work Maternal (Addendum)
CLINICAL SOCIAL WORK MATERNAL/CHILD NOTE  Patient Details  Name: April [name] MRN: [id number] Date of Birth: 11/23/1980  Date:  06/01/2020  Clinical Social Worker Initiating Note:  Emalyn Schou, MSW, LCSWA Date/Time: Initiated:  06/01/20/0950     Child's Name:  Alexandria Hodges   Biological Parents:  Mother,Father (Fredrick Hodges 08/10/1978)   Need for Interpreter:  None   Reason for Referral:  Current Substance Use/Substance Use During Pregnancy ,Late or No Prenatal Care    Address:  [address] [place] [address] (Permanent Address) [address] [place],  [address] (FOB Address)   Phone number:  [phone] (home)     Additional phone number:   Household Members/Support Persons (HM/SP):   Household Member/Support Person 1   HM/SP Name Relationship DOB or Age  HM/SP -1 Linda Weingart Mother 07/10/1957  HM/SP -2        HM/SP -3        HM/SP -4        HM/SP -5        HM/SP -6        HM/SP -7        HM/SP -8          Natural Supports (not living in the home):  Friends,Immediate Family,Spouse/significant other   Professional Supports: None   Employment: Part-time   Type of Work: [employer]   Education:  High school graduate   Homebound arranged:    Financial Resources:  Medicaid   Other Resources:  WIC   Cultural/Religious Considerations Which May Impact Care:    Strengths:  Ability to meet basic needs ,Pediatrician chosen,Home prepared for child    Psychotropic Medications:         Pediatrician:    [place]  Pediatrician List:   [place]    [place]    [place]    [place] Other ([facility])  [place]    [place]      Pediatrician Fax Number:    Risk Factors/Current Problems:  Substance Use    Cognitive State:  Alert ,Goal Oriented ,Linear Thinking ,Insightful    Mood/Affect:  Happy ,Bright ,Interested ,Calm    CSW Assessment: CSW consulted for LPNC,  history of depression and THC use. CSW met with MOB to complete assessment and offer support. CSW introduced self and role. CSW observed FOB on couch and infant sleeping on her back next to MOB. CSW offered to return to speak with MOB alone. MOB declined and stated FOB could remain in room. CSW informed MOB of reason for consult and assessed current mood. MOB reported she is currently feeling good and had a smooth pregnancy. CSW inquired on any barriers to prenatal care. MOB stated she went to the health department initially and was never made aware she qualified for pregnancy medicaid. MOB reported she was supposed to have a referral made to [place] and never heard anything. MOB stated "it was a lot of back and forth, I just gave up." MOB denies any additional barriers. CSW informed MOB of the hospital drug screen policy. MOB was informed infant's UDS is negative and the CDS will be followed. CSW inquired on substance use. MOB disclosed she smoked cigarettes and some THC during pregnancy. MOB reported she last used THC around the fourth month of pregnancy. MOB did not provide and explanation for use when asked and stated she used "every once in awhile." CSW informed MOB that a CPS report will be made if infant's CDS   is positive for substances. MOB expressed understanding and denies any current or previous CPS history.   CSW discussed MOB mental health history. MOB reported she was diagnosed with depression and anxiety following the birth of her adult son Hyacinth Meeker 07/26/1999) in 2001. MOB stated she was prescribed different medications and also attended therapy at that time. MOB shared both were helpful. MOB denies experiencing any anxiety or depression during the pregnancy. MOB identified FOB, her mother, and friend as primary supports. MOB denies any current SI or HI.   CSW provided education regarding the baby blues period versus PPD and provided resources. CSW provided the New Mom Checklist and  encouraged MOB to self evaluate and contact a medical professional if symptoms are noted at any time.  CSW provided review of Sudden Infant Death Syndrome (SIDS) precautions. MOB reported she has all essentials for infant, including a car seat. MOB denies any barriers to follow-up care. MOB expressed no additional needs at this time.    CSW will continue to follow CDS and make a CPS report if warranted. CSW identifies no further need for intervention and no barriers to discharge at this time.  CSW Plan/Description:  CSW Will Continue to Monitor Umbilical Cord Tissue Drug Screen Results and Make Report if Warranted,Child Protective Service Report ,Hospital Drug Screen Policy Information,Perinatal Mood and Anxiety Disorder (PMADs) Education,Sudden Infant Death Syndrome (SIDS) Education,Other Patient/Family Education,No Further Intervention Required/No Barriers to [employer] Information/Referral to [employer], [place] 06/01/2020, 10:05 AM

## 2020-06-02 ENCOUNTER — Other Ambulatory Visit (HOSPITAL_COMMUNITY): Payer: Self-pay

## 2020-06-02 MED ORDER — NICOTINE POLACRILEX 2 MG MT GUM
2.0000 mg | CHEWING_GUM | OROMUCOSAL | 0 refills | Status: DC | PRN
  Filled 2020-06-02: qty 100, fill #0

## 2020-06-02 MED ORDER — IBUPROFEN 600 MG PO TABS
600.0000 mg | ORAL_TABLET | Freq: Three times a day (TID) | ORAL | 0 refills | Status: DC | PRN
  Filled 2020-06-02: qty 30, 10d supply, fill #0

## 2020-06-02 MED ORDER — NICOTINE 7 MG/24HR TD PT24
7.0000 mg | MEDICATED_PATCH | Freq: Every day | TRANSDERMAL | 3 refills | Status: DC
  Filled 2020-06-02: qty 28, 28d supply, fill #0

## 2020-06-02 MED ORDER — COCONUT OIL OIL: 1.0000 "application " | TOPICAL_OIL | 0 refills | Status: DC | PRN

## 2020-06-02 MED ORDER — ACETAMINOPHEN 325 MG PO TABS: 650.0000 mg | ORAL_TABLET | Freq: Four times a day (QID) | ORAL | Status: DC | PRN

## 2020-06-07 ENCOUNTER — Ambulatory Visit (INDEPENDENT_AMBULATORY_CARE_PROVIDER_SITE_OTHER): Payer: Medicaid Other | Admitting: Obstetrics and Gynecology

## 2020-06-07 ENCOUNTER — Other Ambulatory Visit: Payer: Self-pay

## 2020-06-07 ENCOUNTER — Encounter: Payer: Self-pay | Admitting: Obstetrics and Gynecology

## 2020-06-07 VITALS — BP 148/92 | HR 81 | Ht 64.0 in | Wt 134.6 lb

## 2020-06-07 DIAGNOSIS — O139 Gestational [pregnancy-induced] hypertension without significant proteinuria, unspecified trimester: Secondary | ICD-10-CM

## 2020-06-07 MED ORDER — NIFEDIPINE ER OSMOTIC RELEASE 30 MG PO TB24: 30.0000 mg | ORAL_TABLET | Freq: Every day | ORAL | 2 refills | Status: DC

## 2020-06-07 NOTE — Progress Notes (Signed)
Ms Bartosiewicz presents for BP check. She is 1 week PP from a TSVD with GHTN She has no complaints today Denies HA or visual changes. Ambulating, voiding and tolerating diet without problems  PE AF BP as recorded Lungs clear Heart RRR Abd soft + BS U -3  A/P GHTN Will start Procardia Xl 30 mg qd. Indications reviewed with pt. F/U in 1 week for nurse visit for BP check. Pt already has PP appt.

## 2020-06-12 ENCOUNTER — Telehealth: Payer: Self-pay

## 2020-06-14 ENCOUNTER — Other Ambulatory Visit: Payer: Self-pay

## 2020-06-14 ENCOUNTER — Ambulatory Visit (INDEPENDENT_AMBULATORY_CARE_PROVIDER_SITE_OTHER): Payer: Medicaid Other

## 2020-06-14 VITALS — BP 139/86 | HR 78 | Ht 64.0 in | Wt 134.0 lb

## 2020-06-14 DIAGNOSIS — Z8679 Personal history of other diseases of the circulatory system: Secondary | ICD-10-CM

## 2020-06-14 NOTE — Progress Notes (Addendum)
   NURSE VISIT- BLOOD PRESSURE CHECK  SUBJECTIVE:  April [name] is a 40 y.o. [phone] female here for BP check. She is postpartum, delivery date 05/31/20    HYPERTENSION ROS:  Pregnant/postpartum:  . Severe headaches that don't go away with tylenol/other medicines: No  . Visual changes (seeing spots/double/blurred vision) No  . Severe pain under right breast breast or in center of upper chest No  . Severe nausea/vomiting No  . Taking medicines as instructed yes  OBJECTIVE:  BP 139/86 (BP Location: Right Arm, Patient Position: Sitting, Cuff Size: Normal)   Pulse 78   Ht 5\' 4"  (1.626 m)   Wt 134 lb (60.8 kg)   Breastfeeding No   BMI 23.00 kg/m   Appearance alert, well appearing, and in no distress, oriented to person, place, and time and normal appearing weight.  ASSESSMENT: Postpartum  blood pressure check  PLAN: Discussed with , CNM   Recommendations: stop medicine 2 days before next visit, check BP at home every other day in the AM Follow-up: as scheduled   Neils Sirac[place] A Renald Haithcock  06/14/2020 2:58 PM   Chart reviewed for nurse visit. Agree with plan of care.  06/16/2020, CNM 06/14/2020 11:02 PM

## 2020-07-05 ENCOUNTER — Ambulatory Visit: Payer: Medicaid Other | Admitting: Advanced Practice Midwife

## 2020-07-10 ENCOUNTER — Encounter: Payer: Self-pay | Admitting: Women's Health

## 2020-07-10 ENCOUNTER — Ambulatory Visit (INDEPENDENT_AMBULATORY_CARE_PROVIDER_SITE_OTHER): Payer: Medicaid Other | Admitting: Women's Health

## 2020-07-10 ENCOUNTER — Other Ambulatory Visit: Payer: Self-pay

## 2020-07-10 DIAGNOSIS — O165 Unspecified maternal hypertension, complicating the puerperium: Secondary | ICD-10-CM

## 2020-07-10 DIAGNOSIS — Z30430 Encounter for insertion of intrauterine contraceptive device: Secondary | ICD-10-CM

## 2020-07-10 DIAGNOSIS — Z3202 Encounter for pregnancy test, result negative: Secondary | ICD-10-CM

## 2020-07-10 DIAGNOSIS — Z8759 Personal history of other complications of pregnancy, childbirth and the puerperium: Secondary | ICD-10-CM | POA: Insufficient documentation

## 2020-07-10 LAB — POCT URINE PREGNANCY: Preg Test, Ur: NEGATIVE

## 2020-07-10 MED ORDER — LEVONORGESTREL 20.1 MCG/DAY IU IUD
1.0000 | INTRAUTERINE_SYSTEM | Freq: Once | INTRAUTERINE | Status: AC
  Administered 2020-07-10: 1 via INTRAUTERINE

## 2020-07-10 NOTE — Progress Notes (Signed)
POSTPARTUM VISIT Patient name: April [name] MRN [id number]  Date of birth: 1980/03/13 Chief Complaint:   Postpartum Care (Interested in IUD)  History of Present Illness:   April [name] is a 40 y.o. G63P2012 Caucasian female being seen today for a postpartum visit. She is 5 weeks postpartum following a spontaneous vaginal delivery at 40.0 gestational weeks. IOL: no, for n/a. Anesthesia:  IV fentanyl .  Laceration: none.  Complications: precipitous labor, was 1.5/70/-2 on admit and 1hr later gave birth. Inpatient contraception: no.   Pregnancy complicated by late prenatal care @ 35wks . Tobacco use: yes. 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 Next pap smear due: 2025 No LMP recorded.  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 Stopped 2d ago as directed . Bleeding  finishing up a period . Bowel function is normal. Bladder function is normal. Urinary incontinence? no, fecal incontinence? no Patient is not sexually active. Last sexual activity: prior to birth of baby. Desired contraception: IUD. Patient does not want a pregnancy in the future.  Desired family size is 2 children.   Upstream - 07/10/20 1428       Pregnancy Intention Screening   Does the patient want to become pregnant in the next year? No    Does the patient's partner want to become pregnant in the next year? No    Would the patient like to discuss contraceptive options today? Yes      Contraception Wrap Up   Current Method Abstinence    End Method IUD or IUS            The pregnancy intention screening data noted above was reviewed. Potential methods of contraception were discussed. The patient elected to proceed with IUD or IUS.   Edinburgh Postpartum Depression Screening: negative  Edinburgh Postnatal Depression Scale - 07/10/20 1429       Edinburgh Postnatal Depression Scale:   In the Past 7 Days   I have been able to laugh and see the funny side of things. 0    I have looked forward with enjoyment to things. 0    I have blamed myself unnecessarily when things went wrong. 0    I have been anxious or worried for no good reason. 1    I have felt scared or panicky for no good reason. 0    Things have been getting on top of me. 0    I have been so unhappy that I have had difficulty sleeping. 0    I have felt sad or miserable. 0    I have been so unhappy that I have been crying. 0    The thought of harming myself has occurred to me. 0    Edinburgh Postnatal Depression Scale Total 1             GAD 7 : Generalized Anxiety Score 05/02/2020  Nervous, Anxious, on Edge 0  Control/stop worrying 1  Worry too much - different things 1  Trouble relaxing 1  Restless 0  Easily annoyed or irritable 1  Afraid - awful might happen 1  Total GAD 7 Score 5     Baby's course has been uncomplicated. Baby is feeding by bottle. Infant has a pediatrician/family doctor? Yes.  Childcare strategy if returning to work/school: n/a-stay at home mom.  Pt has material needs met for her and baby:  Yes.   Review of Systems:   Pertinent items are noted in HPI Denies Abnormal vaginal discharge w/ itching/odor/irritation, headaches, visual changes, shortness of breath, chest pain, abdominal pain, severe nausea/vomiting, or problems with urination or bowel movements. Pertinent History Reviewed:  Reviewed past medical,surgical, obstetrical and family history.  Reviewed problem list, medications and allergies. OB History  Gravida Para Term Preterm AB Living  3 2 2   1 2   SAB IAB Ectopic Multiple Live Births  1     0 2    # Outcome Date GA Lbr Len/2nd Weight Sex Delivery Anes PTL Lv  3 Term 05/31/20 [redacted]w[redacted]d/ 00:10 5 lb 14.2 oz (2.671 kg) F Vag-Spont None  LIV  2 SAB 2020 643w0d       1 Term 07/26/99 3869w0d lb 8 oz (2.948 kg) M Vag-Spont None N LIV   Physical Assessment:   Vitals:    07/10/20 1416  BP: (!) 143/88  Pulse: 73  Weight: 127 lb (57.6 kg)  Height: 5' 4"  (1.626 m)  Body mass index is 21.8 kg/m.       Physical Examination:   General appearance: alert, well appearing, and in no distress  Mental status: alert, oriented to person, place, and time  Skin: warm & dry   Cardiovascular: normal heart rate noted   Respiratory: normal respiratory effort, no distress   Breasts: deferred, no complaints   Abdomen: soft, non-tender   Pelvic: VULVA: normal appearing vulva with no masses, tenderness or lesions, VAGINA: normal appearing vagina with normal color and discharge, no lesions, CERVIX: normal appearing cervix without discharge or lesions. Thin prep pap obtained: No  Rectal: not examined  Extremities: Edema: none        Results for orders placed or performed in visit on 07/10/20 (from the past 24 hour(s))  POCT urine pregnancy   Collection Time: 07/10/20  2:20 PM  Result Value Ref Range   Preg Test, Ur Negative Negative     IUD INSERTION The risks and benefits of the method and placement have been thouroughly reviewed with the patient and all questions were answered.  Specifically the patient is aware of failure rate of 01/998, expulsion of the IUD and of possible perforation.  The patient is aware of irregular bleeding due to the method and understands the incidence of irregular bleeding diminishes with time.  Signed copy of informed consent in chart.   Time out was performed.  A graves speculum was placed in the vagina.  The cervix was visualized, prepped using Betadine, and grasped with a single tooth tenaculum. The uterus was found to be neutral and it sounded to 9 cm.  Liletta  IUD placed per manufacturer's recommendations. The strings were trimmed to approximately 3 cm. The patient tolerated the procedure well.   Informal transvaginal sonogram was performed and the proper placement of the IUD was verified.  Chaperone: JanLevy Pupa Assessment & Plan:   1) Postpartum exam 2) 5 wks s/p spontaneous vaginal delivery 3) bottle feeding 4) Depression screening 5) PPHTN> restart procardia 76m60mtop 2d before f/u visit in 4wks 6) Liletta IUD insertion The patient was given post procedure instructions, including signs and symptoms of infection and to check for the strings after each menses or each month, and refraining from intercourse or anything in the vagina for 3 days.  Condoms for 2 weeks. She was given a care card with date IUD placed, and date IUD to be removed.  She is scheduled for a f/u appointment in 4 weeks.  Essential components of care per ACOG recommendations:  1.  Mood and well being:  If positive depression screen, discussed and plan developed.  If using tobacco we discussed reduction/cessation and risk of relapse If current substance abuse, we discussed and referral to local resources was offered.   2. Infant care and feeding:  If breastfeeding, discussed returning to work, pumping, breastfeeding-associated pain, guidance regarding return to fertility while lactating if not using another method. If needed, patient was provided with a letter to be allowed to pump q 2-3hrs to support lactation in a private location with access to a refrigerator to store breastmilk.   Recommended that all caregivers be immunized for flu, pertussis and other preventable communicable diseases If pt does not have material needs met for her/baby, referred to local resources for help obtaining these.  3. Sexuality, contraception and birth spacing Provided guidance regarding sexuality, management of dyspareunia, and resumption of intercourse Discussed avoiding interpregnancy interval <30mhs and recommended birth spacing of 18 months  4. Sleep and fatigue Discussed coping options for fatigue and sleep disruption Encouraged family/partner/community support of 4 hrs of uninterrupted sleep to help with mood and fatigue  5. Physical recovery  If pt had a  C/S, assessed incisional pain and providing guidance on normal vs prolonged recovery If pt had a laceration, perineal healing and pain reviewed.  If urinary or fecal incontinence, discussed management and referred to PT or uro/gyn if indicated  Patient is safe to resume physical activity. Discussed attainment of healthy weight.  6.  Chronic disease management Discussed pregnancy complications if any, and their implications for future childbearing and long-term maternal health. Review recommendations for prevention of recurrent pregnancy complications, such as 17 hydroxyprogesterone caproate to reduce risk for recurrent PTB not applicable, or aspirin to reduce risk of preeclampsia yes. Pt had GDM: No. If yes, 2hr GTT scheduled: not applicable. Reviewed medications and non-pregnant dosing including consideration of whether pt is breastfeeding using a reliable resource such as LactMed: not applicable Referred for f/u w/ PCP or subspecialist providers as indicated: not applicable  7. Health maintenance Mammogram at [id number] earlier if indicated Pap smears as indicated  Meds: No orders of the defined types were placed in this encounter.   Follow-up: Return in about 4 weeks (around 08/07/2020) for IUD & bp f/u, CNM, in person.   Orders Placed This Encounter  Procedures   POCT urine pregnancy    [facility]6/20/2022 2:59 PM

## 2020-07-10 NOTE — Addendum Note (Signed)
Addended by: Colen Darling on: 07/10/2020 03:03 PM   Modules accepted: Orders

## 2020-07-10 NOTE — Patient Instructions (Addendum)
Nothing in vagina for 3 days (no sex, douching, tampons, etc...) Check your strings once a month to make sure you can feel them, if you are not able to please let us know If you develop a fever of 100.4 or more in the next few weeks, or if you develop severe abdominal pain, please let [place] know Use a backup method of birth control, such as condoms, for 2 weeks    RESTART PROCARDIA (NIFEDIPINE), STOP 2 DAYS BEFORE YOUR CHECK UP VISIT IN 4WKS   Intrauterine Device Insertion, Care After This sheet gives you information about how to care for yourself after your procedure. Your health care provider may also give you more specific instructions. If you have problems or questions, contact your health careprovider. What can I expect after the procedure? After the procedure, it is common to have: Cramps and pain in the abdomen. Bleeding. It may be light or heavy. This may last for a few days. Lower back pain. Dizziness. Headaches. Nausea. Follow these instructions at home:  Before resuming sexual activity, check to make sure that you can feel the IUD string or strings. You should be able to feel the end of the string below the opening of your cervix. If your IUD string is in place, you may resume sexual activity. If you had a hormonal IUD inserted more than 7 days after your most recent period started, you will need to use a backup method of birth control for 7 days after IUD insertion. Ask your health care provider whether this applies to you. Continue to check that the IUD is still in place by feeling for the strings after every menstrual period, or once a month. An IUD will not protect you from sexually transmitted infections (STIs). Use methods to prevent the exchange of body fluids between partners (barrier protection) every time you have sex. Barrier protection can be used during oral, vaginal, or anal sex. Commonly used barrier methods include: Female condom. Female condom. Dental dam. Take  over-the-counter and prescription medicines only as told by your health care provider. Keep all follow-up visits as told by your health care provider. This is important. Contact a health care provider if: You feel light-headed or weak. You have any of the following problems with your IUD string or strings: The string bothers or hurts you or your sexual partner. You cannot feel the string. The string has gotten longer. You can feel the IUD in your vagina. You think you may be pregnant, or you miss your menstrual period. You think you may have a sexually transmitted infection (STI). Get help right away if: You have flu-like symptoms, such as tiredness (fatigue) and muscle aches. You have a fever and chills. You have bleeding that is heavier or lasts longer than a normal menstrual cycle. You have abnormal or bad-smelling discharge from your vagina. You develop abdominal pain that is new, is getting worse, or is not in the same area of earlier cramping and pain. You have pain during sexual activity. Summary After the procedure, it is common to have cramps and pain in the abdomen. It is also common to have light bleeding or heavier bleeding that is like your menstrual period. Continue to check that the IUD is still in place by feeling for the strings after every menstrual period, or once a month. Keep all follow-up visits as told by your health care provider. This is important. Contact your health care provider if you have problems with your IUD strings, such as  the string getting longer or bothering you or your sexual partner. This information is not intended to replace advice given to you by your health care provider. Make sure you discuss any questions you have with your healthcare provider. Document Revised: 12/29/2018 Document Reviewed: 12/29/2018 Elsevier Patient Education  2022 [employer].

## 2020-08-08 ENCOUNTER — Ambulatory Visit: Payer: Medicaid Other | Admitting: Women's Health

## 2021-12-20 ENCOUNTER — Ambulatory Visit: Payer: Medicaid Other | Admitting: *Deleted

## 2021-12-20 ENCOUNTER — Encounter: Payer: Self-pay | Admitting: Advanced Practice Midwife

## 2021-12-20 ENCOUNTER — Other Ambulatory Visit: Payer: Self-pay | Admitting: Advanced Practice Midwife

## 2021-12-20 ENCOUNTER — Other Ambulatory Visit (INDEPENDENT_AMBULATORY_CARE_PROVIDER_SITE_OTHER): Payer: Medicaid Other

## 2021-12-20 ENCOUNTER — Other Ambulatory Visit (HOSPITAL_COMMUNITY): Payer: Self-pay

## 2021-12-20 ENCOUNTER — Ambulatory Visit (INDEPENDENT_AMBULATORY_CARE_PROVIDER_SITE_OTHER): Payer: Medicaid Other | Admitting: Advanced Practice Midwife

## 2021-12-20 VITALS — BP 127/67 | HR 97 | Wt 138.0 lb

## 2021-12-20 DIAGNOSIS — Z8679 Personal history of other diseases of the circulatory system: Secondary | ICD-10-CM

## 2021-12-20 DIAGNOSIS — Z141 Cystic fibrosis carrier: Secondary | ICD-10-CM | POA: Diagnosis not present

## 2021-12-20 DIAGNOSIS — Z363 Encounter for antenatal screening for malformations: Secondary | ICD-10-CM

## 2021-12-20 DIAGNOSIS — Z3A28 28 weeks gestation of pregnancy: Secondary | ICD-10-CM

## 2021-12-20 DIAGNOSIS — Z3480 Encounter for supervision of other normal pregnancy, unspecified trimester: Secondary | ICD-10-CM

## 2021-12-20 DIAGNOSIS — O0990 Supervision of high risk pregnancy, unspecified, unspecified trimester: Secondary | ICD-10-CM | POA: Insufficient documentation

## 2021-12-20 DIAGNOSIS — F1991 Other psychoactive substance use, unspecified, in remission: Secondary | ICD-10-CM | POA: Diagnosis not present

## 2021-12-20 DIAGNOSIS — Z8759 Personal history of other complications of pregnancy, childbirth and the puerperium: Secondary | ICD-10-CM

## 2021-12-20 DIAGNOSIS — O365990 Maternal care for other known or suspected poor fetal growth, unspecified trimester, not applicable or unspecified: Secondary | ICD-10-CM | POA: Diagnosis not present

## 2021-12-20 DIAGNOSIS — Z975 Presence of (intrauterine) contraceptive device: Secondary | ICD-10-CM | POA: Insufficient documentation

## 2021-12-20 DIAGNOSIS — F332 Major depressive disorder, recurrent severe without psychotic features: Secondary | ICD-10-CM

## 2021-12-20 DIAGNOSIS — O0993 Supervision of high risk pregnancy, unspecified, third trimester: Secondary | ICD-10-CM

## 2021-12-20 MED ORDER — ASPIRIN 81 MG PO TBEC
162.0000 mg | DELAYED_RELEASE_TABLET | Freq: Every day | ORAL | 6 refills | Status: DC
  Filled 2021-12-20: qty 60, 30d supply, fill #0

## 2021-12-20 MED ORDER — ASPIRIN 81 MG PO TBEC: 162.0000 mg | DELAYED_RELEASE_TABLET | Freq: Every day | ORAL | 6 refills | Status: DC

## 2021-12-20 NOTE — Progress Notes (Signed)
[place] with dopplers:single IUP, 28+2 wks,cephalic,posterior placenta gr 0,cx 3.2 cm,possible IUD in cervix,AFI 16.5 cm,elevated UAD with EDF,RI .78,.83,.71,.79,.81=92%,FHR 135 bpm,EFW 996 g 5.8%,AC 7.7%,anatomy complete EDD BY LMP 03/12/2022

## 2021-12-20 NOTE — Progress Notes (Signed)
INITIAL OBSTETRICAL VISIT Patient name: April [name] MRN [id number]  Date of birth: [birth year] Chief Complaint:   Initial Prenatal Visit  History of Present Illness:   April [name] is a 41 y.o. G64P2012 Caucasian female at [redacted]w[redacted]d by sure LMP: however, she still has an IUD (used to feel strings, can't feel them now).   with an Estimated Date of Delivery: 03/12/22 being seen today for her initial obstetrical visit.   Her obstetrical history is significant for advanced maternal age, intrauterine growth restriction (IUGR), smoker, and opiate and cocaine abuse, major depression, CF carrier, late prenatal care .   Today she reports feeling well.  Discussed opiate abuse, last used a year or so ago. Would like to explore suboxone so as not to relapse.  Has depression and anxiety. Offered [facility] referral, card given. Amber worked her in for an [place], see her report . Did not have time to do a BPP.      12/20/2021    2:29 PM 05/02/2020    9:43 AM  Depression screen PHQ 2/9  Decreased Interest 0 0  Down, Depressed, Hopeless 0 0  PHQ - 2 Score 0 0  Altered sleeping 0 1  Tired, decreased energy 0 1  Change in appetite 0 0  Feeling bad or failure about yourself  0 0  Trouble concentrating 0 0  Moving slowly or fidgety/restless 0 0  Suicidal thoughts 0 0  PHQ-9 Score 0 2       Patient's last menstrual period was 06/05/2021. Last pap 05/02/20. Results were: normal Review of Systems:   Pertinent items are noted in HPI Denies cramping/contractions, leakage of fluid, vaginal bleeding, abnormal vaginal discharge w/ itching/odor/irritation, headaches, visual changes, shortness of breath, chest pain, abdominal pain, severe nausea/vomiting, or problems with urination or bowel movements unless otherwise stated above.  Pertinent History Reviewed:  Reviewed past medical,surgical, social, obstetrical and family history.  Reviewed problem list, medications and allergies. OB History  Gravida Para Term Preterm  AB Living  4 2 2   1 2   SAB IAB Ectopic Multiple Live Births  1     0 2    # Outcome Date GA Lbr Len/2nd Weight Sex Delivery Anes PTL Lv  4 Current           3 Term 05/31/20 [redacted]w[redacted]d / 00:10 5 lb 14.2 oz (2.671 kg) F Vag-Spont None N LIV     Complications: Postpartum hypertension  2 SAB 2020 [redacted]w[redacted]d         1 Term 07/26/99 [redacted]w[redacted]d  6 lb 8 oz (2.948 kg) M Vag-Spont None N LIV   Physical Assessment:   Vitals:   12/20/21 1441  BP: 127/67  Pulse: 97  Weight: 138 lb (62.6 kg)  Body mass index is 23.69 kg/m.       Physical Examination:  General appearance - well appearing, and in no distress  Mental status - alert, oriented to person, place, and time  Psych:  She has a normal mood and affect  Skin - warm and dry, normal color, no suspicious lesions noted  Chest - effort normal  Heart - normal rate and regular rhythm  Abdomen - soft, nontender  Extremities:  No swelling or varicosities noted   [place] with dopplers:single IUP, [id number] wks,cephalic,posterior placenta gr 0,cx 3.2 cm,possible IUD in cervix,AFI 16.5 cm,elevated UAD with EDF,RI .78,.83,.71,.79,.81=92%,FHR 135 bpm,EFW 996 g 5.8%,AC 7.7%,anatomy complete EDD BY LMP 03/12/2022   No results found for this or any previous visit (  from the past 24 hour(s)).  Assessment & Plan:  1) High-Risk Pregnancy [id number] at [redacted]w[redacted]d with an Estimated Date of Delivery: 03/12/22   2) Initial OB visit  3) FGR w/elevated dopplers:  weekly BPP/dopplers,  Scheduled at [facility] for next Wednesday  4)  Opiate abuse, history;  note sent to [facility], will call to schedule  5)  major depression/anxiety:  [facility] card given, encouraged to make appt  6)  CF carrier:  thinks FOB will decline testing  Meds:  Meds ordered this encounter  Medications   DISCONTD: aspirin EC 81 MG tablet    Sig: Take 2 tablets (162 mg total) by mouth daily.    Dispense:  60 tablet    Refill:  6    Order Specific Question:   Supervising Provider    Answer:   Lazaro Arms  [[id number]]   aspirin EC 81 MG tablet    Sig: Take 2 tablets (162 mg total) by mouth daily.    Dispense:  60 tablet    Refill:  6    Order Specific Question:   Supervising Provider    Answer:   Duane Lope H [[id number]]    Initial labs obtained Continue prenatal vitamins Reviewed n/v relief measures and warning s/s to report Reviewed recommended weight gain based on pre-gravid BMI Encouraged well-balanced diet Genetic & carrier screening discussed: requests Panorama, declines NT/IT, AFP, and Horizon  Ultrasound discussed; fetal survey: requested CCNC completed> form faxed if has or is planning to apply for medicaid The nature of [place] [facility] [employer] with multiple MDs and other Advanced Practice Providers was explained to patient; also emphasized that fellows, residents, and students are part of our team. Has home bp cuff.. Check bp weekly, let us know if >140/90.        Scarlette Calico Cresenzo-Dishmon 4:51 PM

## 2021-12-21 ENCOUNTER — Telehealth: Payer: Self-pay | Admitting: Obstetrics & Gynecology

## 2021-12-22 LAB — URINE CULTURE

## 2021-12-24 ENCOUNTER — Ambulatory Visit (INDEPENDENT_AMBULATORY_CARE_PROVIDER_SITE_OTHER): Payer: Medicaid Other | Admitting: Obstetrics & Gynecology

## 2021-12-24 ENCOUNTER — Encounter: Payer: Self-pay | Admitting: Obstetrics & Gynecology

## 2021-12-24 ENCOUNTER — Other Ambulatory Visit: Payer: Medicaid Other

## 2021-12-24 ENCOUNTER — Other Ambulatory Visit: Payer: Self-pay | Admitting: Obstetrics & Gynecology

## 2021-12-24 VITALS — BP 118/81 | HR 103 | Wt 137.0 lb

## 2021-12-24 DIAGNOSIS — O0993 Supervision of high risk pregnancy, unspecified, third trimester: Secondary | ICD-10-CM

## 2021-12-24 DIAGNOSIS — Z23 Encounter for immunization: Secondary | ICD-10-CM

## 2021-12-24 DIAGNOSIS — O365990 Maternal care for other known or suspected poor fetal growth, unspecified trimester, not applicable or unspecified: Secondary | ICD-10-CM

## 2021-12-24 DIAGNOSIS — Z3A28 28 weeks gestation of pregnancy: Secondary | ICD-10-CM

## 2021-12-24 DIAGNOSIS — Z131 Encounter for screening for diabetes mellitus: Secondary | ICD-10-CM

## 2021-12-24 LAB — OB RESULTS CONSOLE RUBELLA ANTIBODY, IGM: Rubella: IMMUNE

## 2021-12-24 LAB — GC/CHLAMYDIA PROBE AMP
Chlamydia trachomatis, NAA: NEGATIVE
Neisseria Gonorrhoeae by PCR: NEGATIVE

## 2021-12-24 LAB — HEPATITIS C ANTIBODY: HCV Ab: NEGATIVE

## 2021-12-24 LAB — OB RESULTS CONSOLE HIV ANTIBODY (ROUTINE TESTING): HIV: NONREACTIVE

## 2021-12-24 LAB — OB RESULTS CONSOLE HEPATITIS B SURFACE ANTIGEN: Hepatitis B Surface Ag: NEGATIVE

## 2021-12-24 NOTE — Progress Notes (Signed)
HIGH-RISK PREGNANCY VISIT Patient name: April [name] MRN [id number]  Date of birth: [birth year] Chief Complaint:   Routine Prenatal Visit  History of Present Illness:   April [name] is a 41 y.o. [id number] female at [redacted]w[redacted]d with an Estimated Date of Delivery: 03/12/22 being seen today for ongoing management of a high-risk pregnancy complicated by fetal growth restriction 5.8% w/92% UAD.    Today she reports no complaints. Contractions: Not present. Vag. Bleeding: None.  Movement: Present. denies leaking of fluid.      12/20/2021    2:29 PM 05/02/2020    9:43 AM  Depression screen PHQ 2/9  Decreased Interest 0 0  Down, Depressed, Hopeless 0 0  PHQ - 2 Score 0 0  Altered sleeping 0 1  Tired, decreased energy 0 1  Change in appetite 0 0  Feeling bad or failure about yourself  0 0  Trouble concentrating 0 0  Moving slowly or fidgety/restless 0 0  Suicidal thoughts 0 0  PHQ-9 Score 0 2        12/20/2021    2:43 PM 05/02/2020    9:43 AM  GAD 7 : Generalized Anxiety Score  Nervous, Anxious, on Edge 1 0  Control/stop worrying 1 1  Worry too much - different things 0 1  Trouble relaxing 1 1  Restless 0 0  Easily annoyed or irritable 1 1  Afraid - awful might happen 1 1  Total GAD 7 Score 5 5     Review of Systems:   Pertinent items are noted in HPI Denies abnormal vaginal discharge w/ itching/odor/irritation, headaches, visual changes, shortness of breath, chest pain, abdominal pain, severe nausea/vomiting, or problems with urination or bowel movements unless otherwise stated above. Pertinent History Reviewed:  Reviewed past medical,surgical, social, obstetrical and family history.  Reviewed problem list, medications and allergies. Physical Assessment:   Vitals:   12/24/21 0905  BP: 118/81  Pulse: (!) 103  Weight: 137 lb (62.1 kg)  Body mass index is 23.52 kg/m.           Physical Examination:   General appearance: alert, well appearing, and in no distress  Mental  status: alert, oriented to person, place, and time  Skin: warm & dry   Extremities: Edema: None    Cardiovascular: normal heart rate noted  Respiratory: normal respiratory effort, no distress  Abdomen: gravid, soft, non-tender  Pelvic: Cervical exam deferred         Fetal Status:     Movement: Present    Fetal Surveillance Testing today: FHR 155   Chaperone: N/A    No results found for this or any previous visit (from the past 24 hour(s)).  Assessment & Plan:  High-risk pregnancy: [id number] at [redacted]w[redacted]d with an Estimated Date of Delivery: 03/12/22      ICD-10-CM   1. High-risk pregnancy in third trimester  O09.93     2. Fetal growth restriction antepartum, 5.8% w/92% UAD(28 weeks)  O36.5990    weekly BPP+ Dopplers til 32 weeks then add NSTs as well    3. [redacted] weeks gestation of pregnancy  Z3A.28 Tdap vaccine greater than or equal to 7yo IM        Meds: No orders of the defined types were placed in this encounter.   Orders:  Orders Placed This Encounter  Procedures   Tdap vaccine greater than or equal to 7yo IM     Labs/procedures today: none  Treatment Plan:  weekly BPP + [place] 28-32  weeks then add NSTs at 32 weeks   Follow-up: Return if symptoms worsen or fail to improve.   Future Appointments  Date Time Provider Department Center  12/24/2021 10:10 AM Lazaro Arms, MD [facility]  12/26/2021  2:00 PM [facility] US OB 1 [facility]  01/01/2022  1:45 PM Venora Maples, MD [facility]  01/02/2022 10:30 AM [facility] NURSE [facility]  01/02/2022 10:45 AM [facility] US7 [facility]  01/09/2022 10:00 AM [facility] [place] [facility] None  01/16/2022  1:30 PM [facility] NURSE [facility]  01/16/2022  1:45 PM [facility] US7 [facility]  01/23/2022  9:00 AM [facility] [place] [facility] None    Orders Placed This Encounter  Procedures   Tdap vaccine greater than or equal to 7yo IM   Lazaro Arms  Attending Physician for the Center for [facility] 12/24/2021 10:00 AM

## 2021-12-24 NOTE — Progress Notes (Signed)
Orders for ultrasound

## 2021-12-25 LAB — CBC/D/PLT+RPR+RH+ABO+RUBIGG...
Antibody Screen: NEGATIVE
Basophils Absolute: 0 10*3/uL (ref 0.0–0.2)
Basos: 0 %
EOS (ABSOLUTE): 0 10*3/uL (ref 0.0–0.4)
Eos: 0 %
HCV Ab: NONREACTIVE
HIV Screen 4th Generation wRfx: NONREACTIVE
Hematocrit: 32 % — ABNORMAL LOW (ref 34.0–46.6)
Hemoglobin: 10.9 g/dL — ABNORMAL LOW (ref 11.1–15.9)
Hepatitis B Surface Ag: NEGATIVE
Immature Grans (Abs): 0.1 10*3/uL (ref 0.0–0.1)
Immature Granulocytes: 1 %
Lymphocytes Absolute: 1.6 10*3/uL (ref 0.7–3.1)
Lymphs: 15 %
MCH: 32.4 pg (ref 26.6–33.0)
MCHC: 34.1 g/dL (ref 31.5–35.7)
MCV: 95 fL (ref 79–97)
Monocytes Absolute: 0.4 10*3/uL (ref 0.1–0.9)
Monocytes: 4 %
Neutrophils Absolute: 8.3 10*3/uL — ABNORMAL HIGH (ref 1.4–7.0)
Neutrophils: 80 %
Platelets: 268 10*3/uL (ref 150–450)
RBC: 3.36 x10E6/uL — ABNORMAL LOW (ref 3.77–5.28)
RDW: 11.9 % (ref 11.7–15.4)
RPR Ser Ql: NONREACTIVE
Rh Factor: POSITIVE
Rubella Antibodies, IGG: 2.96 index (ref >0.99)
WBC: 10.4 10*3/uL (ref 3.4–10.8)

## 2021-12-25 LAB — COMPREHENSIVE METABOLIC PANEL
ALT: 9 IU/L (ref 0–32)
AST: 14 IU/L (ref 0–40)
Albumin/Globulin Ratio: 1.6 (ref 1.2–2.2)
Albumin: 3.5 g/dL — ABNORMAL LOW (ref 3.9–4.9)
Alkaline Phosphatase: 82 IU/L (ref 44–121)
BUN/Creatinine Ratio: 15 (ref 9–23)
BUN: 6 mg/dL (ref 6–24)
Bilirubin Total: 0.4 mg/dL (ref 0.0–1.2)
CO2: 20 mmol/L (ref 20–29)
Calcium: 8.2 mg/dL — ABNORMAL LOW (ref 8.7–10.2)
Chloride: 103 mmol/L (ref 96–106)
Creatinine, Ser: 0.41 mg/dL — ABNORMAL LOW (ref 0.57–1.00)
Globulin, Total: 2.2 g/dL (ref 1.5–4.5)
Glucose: 79 mg/dL (ref 70–99)
Potassium: 3.4 mmol/L — ABNORMAL LOW (ref 3.5–5.2)
Sodium: 138 mmol/L (ref 134–144)
Total Protein: 5.7 g/dL — ABNORMAL LOW (ref 6.0–8.5)
eGFR: 127 mL/min/{1.73_m2} (ref >59)

## 2021-12-25 LAB — GLUCOSE TOLERANCE, 2 HOURS W/ 1HR
Glucose, 1 hour: 147 mg/dL (ref 70–179)
Glucose, 2 hour: 101 mg/dL (ref 70–152)
Glucose, Fasting: 77 mg/dL (ref 70–91)

## 2021-12-25 LAB — PROTEIN / CREATININE RATIO, URINE
Creatinine, Urine: 34 mg/dL
Protein, Ur: 4.3 mg/dL
Protein/Creat Ratio: 126 mg/g creat (ref 0–200)

## 2021-12-25 LAB — HCV INTERPRETATION

## 2021-12-26 ENCOUNTER — Encounter: Payer: Self-pay | Admitting: Advanced Practice Midwife

## 2021-12-26 ENCOUNTER — Ambulatory Visit (INDEPENDENT_AMBULATORY_CARE_PROVIDER_SITE_OTHER): Payer: Medicaid Other

## 2021-12-26 ENCOUNTER — Other Ambulatory Visit (HOSPITAL_BASED_OUTPATIENT_CLINIC_OR_DEPARTMENT_OTHER): Payer: Self-pay | Admitting: Advanced Practice Midwife

## 2021-12-26 DIAGNOSIS — O0993 Supervision of high risk pregnancy, unspecified, third trimester: Secondary | ICD-10-CM | POA: Diagnosis not present

## 2021-12-26 DIAGNOSIS — F332 Major depressive disorder, recurrent severe without psychotic features: Secondary | ICD-10-CM

## 2021-12-26 DIAGNOSIS — Z3A34 34 weeks gestation of pregnancy: Secondary | ICD-10-CM

## 2021-12-26 DIAGNOSIS — O365931 Maternal care for other known or suspected poor fetal growth, third trimester, fetus 1: Secondary | ICD-10-CM | POA: Diagnosis not present

## 2021-12-26 DIAGNOSIS — Z8679 Personal history of other diseases of the circulatory system: Secondary | ICD-10-CM

## 2021-12-26 DIAGNOSIS — F1991 Other psychoactive substance use, unspecified, in remission: Secondary | ICD-10-CM

## 2021-12-26 DIAGNOSIS — O365990 Maternal care for other known or suspected poor fetal growth, unspecified trimester, not applicable or unspecified: Secondary | ICD-10-CM

## 2021-12-26 DIAGNOSIS — Z363 Encounter for antenatal screening for malformations: Secondary | ICD-10-CM

## 2021-12-26 DIAGNOSIS — Z3480 Encounter for supervision of other normal pregnancy, unspecified trimester: Secondary | ICD-10-CM

## 2021-12-26 DIAGNOSIS — Z141 Cystic fibrosis carrier: Secondary | ICD-10-CM

## 2021-12-26 DIAGNOSIS — Z975 Presence of (intrauterine) contraceptive device: Secondary | ICD-10-CM

## 2021-12-26 NOTE — Addendum Note (Signed)
Addended by: Harrie Jeans on: 12/26/2021 12:17 PM   Modules accepted: Orders

## 2021-12-30 LAB — PANORAMA PRENATAL TEST FULL PANEL:PANORAMA TEST PLUS 5 ADDITIONAL MICRODELETIONS: FETAL FRACTION: 17.2

## 2022-01-01 ENCOUNTER — Telehealth (INDEPENDENT_AMBULATORY_CARE_PROVIDER_SITE_OTHER): Payer: Medicaid Other | Admitting: Family Medicine

## 2022-01-01 ENCOUNTER — Encounter: Payer: Self-pay | Admitting: Family Medicine

## 2022-01-01 DIAGNOSIS — F1191 Opioid use, unspecified, in remission: Secondary | ICD-10-CM | POA: Insufficient documentation

## 2022-01-01 DIAGNOSIS — O99323 Drug use complicating pregnancy, third trimester: Secondary | ICD-10-CM

## 2022-01-01 DIAGNOSIS — Z3A30 30 weeks gestation of pregnancy: Secondary | ICD-10-CM

## 2022-01-01 MED ORDER — BUPRENORPHINE HCL-NALOXONE HCL 4-1 MG SL FILM: 1.0000 | ORAL_FILM | Freq: Two times a day (BID) | SUBLINGUAL | 0 refills | Status: AC

## 2022-01-01 MED ORDER — NALOXONE HCL 4 MG/0.1ML NA LIQD: NASAL | 1 refills | Status: DC

## 2022-01-01 NOTE — Progress Notes (Signed)
I connected with [name] 01/01/22 at  1:45 PM EST by: MyChart video and verified that I am speaking with the correct person using two identifiers.  Patient is located at home and provider is located at [facility].     I discussed the limitations, risks, security and privacy concerns of performing an evaluation and management service by MyChart video and the availability of in person appointments. I also discussed with the patient that there may be a patient responsible charge related to this service. By engaging in this virtual visit, you consent to the provision of healthcare.  Additionally, you authorize for your insurance to be billed for the services provided during this visit.  The patient expressed understanding and agreed to proceed.  The following staff members participated in the virtual visit:  April Flock, MD/MPH Attending Family Medicine Physician, [place] for [facility]     PRENATAL VISIT NOTE  Subjective:  April [name] is a 41 y.o. [id number] at [redacted]w[redacted]d  for virtual visit for ongoing prenatal care.  She is currently monitored for the following issues for this high-risk pregnancy and has MDD (major depressive disorder); Cocaine dependence with cocaine-induced mood disorder ([place]); Late prenatal care; History of drug use; History of postpartum hypertension; Depression with anxiety; Smoker; Cystic fibrosis carrier; History of prior pregnancy with IUGR newborn; History of precipitous labor and delivery; High-risk pregnancy; Fetal growth restriction antepartum; IUD (intrauterine device) in place; and Opioid use disorder in remission on their problem list.  Patient reports no complaints.    The following portions of the patient's history were reviewed and updated as appropriate: allergies, current medications, past family history, past medical history, past social history, past surgical history and problem list.    Objective:  There were no vitals filed for this visit. Self-Obtained  Fetal Status:           Assessment and Plan:  Pregnancy: [id number] at [redacted]w[redacted]d 1. Opioid use disorder in remission Patient her for consultation regarding OUD and cocaine use disorder in setting of high risk pregnancy complicated by IUD in situ, IUGR Other pregnancy issues were not addressed at this visit She reports long struggle with substance use starting about 14 years ago when she broke her ankle and was given large rx of narcotics for pain control After that she used regularly on and off Has not used opioids in about a year but has been feeling cravings and is fearful of a relapse She has used suboxone in the past without medical guidance and successfully taken it to get off of pain pills Has been using cocaine, last did so about three weeks ago, trying very hard to avoid  We had a long discussion Reviewed that unfortunately although many medicines have been trialed for cocaine use disorder none have great evidence to support their use Strongly recommended engaging in peer support with NA or other similar groups Regarding OUD congratulated on her sustained remission Given she is not actively using right now wanted to make sure she understood implications for her and for pregnancy/postpartum period, particularly NAS, prior to initiating That being said I was in agreement that starting would be beneficial and would provide protection in case of relapse Given many weeks since last use of any substance, feel that the risk of precipitated withdrawal is low at present just in case it was adulterated with fentanyl Given rx for 4 mg BID x2 weeks, however discussed she has freedom to find  right dose for her even if that is less than 4 mg BID Rx also sent for narcan She is coming to [facility] tomorrow for an [place], and recommended she leave UDS at that time Discussed I recommend UDS with each visit and that it is not used punitively,  only as a harm reduction measure so I can give greater assistance to patients who are struggling and so that both of [place] know what they are actually taking Discussed a UDS is never used to stop prescribing, will only do that if patient's are dismissed from practice for usual reasons (verbally/physically abusive) or if they consistently have UDS without any suboxone present  NAS team referral placed  Will see patient back in 2 weeks  - Buprenorphine HCl-Naloxone HCl (SUBOXONE) 4-1 MG FILM; Place 1 Film under the tongue 2 (two) times daily for 14 days.  Dispense: 28 each; Refill: 0 - ToxASSURE Select 13 (MW), Urine; Future - naloxone (NARCAN) nasal spray 4 mg/0.1 mL; Use as needed to reverse opioid overdose  Dispense: 2 each; Refill: 1 - Amb referral to Neonatal Abstinence Syndrome (NAS) Team  Preterm labor symptoms and general obstetric precautions including but not limited to vaginal bleeding, contractions, leaking of fluid and fetal movement were reviewed in detail with the patient.  Return in about 2 weeks (around 01/15/2022) for OUD f/u, virtual visit.  Future Appointments  Date Time Provider Department Center  01/02/2022 10:30 AM [facility] NURSE [facility]  01/02/2022 10:45 AM [facility] US7 [facility]  01/09/2022 10:00 AM [facility] - [facility]TO[facility]YN [place] [facility] None  01/16/2022  1:30 PM [facility] NURSE [facility]  01/16/2022  1:45 PM [facility] US7 [facility]  01/23/2022  9:00 AM [facility] - FTOBGYN [place] [facility]FT[facility] None     Time spent on virtual visit: 20 minutes  April Maples, MD

## 2022-01-02 ENCOUNTER — Ambulatory Visit: Payer: Medicaid Other | Attending: Obstetrics & Gynecology

## 2022-01-02 ENCOUNTER — Encounter: Payer: Self-pay | Admitting: *Deleted

## 2022-01-02 ENCOUNTER — Other Ambulatory Visit: Payer: Medicaid Other

## 2022-01-02 ENCOUNTER — Ambulatory Visit: Payer: Medicaid Other | Admitting: *Deleted

## 2022-01-02 VITALS — BP 119/73 | HR 92

## 2022-01-02 DIAGNOSIS — O365990 Maternal care for other known or suspected poor fetal growth, unspecified trimester, not applicable or unspecified: Secondary | ICD-10-CM | POA: Insufficient documentation

## 2022-01-02 DIAGNOSIS — O09523 Supervision of elderly multigravida, third trimester: Secondary | ICD-10-CM | POA: Diagnosis not present

## 2022-01-02 DIAGNOSIS — O0933 Supervision of pregnancy with insufficient antenatal care, third trimester: Secondary | ICD-10-CM

## 2022-01-02 DIAGNOSIS — O285 Abnormal chromosomal and genetic finding on antenatal screening of mother: Secondary | ICD-10-CM

## 2022-01-02 DIAGNOSIS — Z975 Presence of (intrauterine) contraceptive device: Secondary | ICD-10-CM | POA: Insufficient documentation

## 2022-01-02 DIAGNOSIS — Z3A29 29 weeks gestation of pregnancy: Secondary | ICD-10-CM

## 2022-01-02 DIAGNOSIS — F1191 Opioid use, unspecified, in remission: Secondary | ICD-10-CM

## 2022-01-02 DIAGNOSIS — Z141 Cystic fibrosis carrier: Secondary | ICD-10-CM

## 2022-01-02 DIAGNOSIS — O365930 Maternal care for other known or suspected poor fetal growth, third trimester, not applicable or unspecified: Secondary | ICD-10-CM | POA: Diagnosis not present

## 2022-01-02 DIAGNOSIS — O09293 Supervision of pregnancy with other poor reproductive or obstetric history, third trimester: Secondary | ICD-10-CM

## 2022-01-05 LAB — TOXASSURE SELECT 13 (MW), URINE

## 2022-01-09 ENCOUNTER — Ambulatory Visit (INDEPENDENT_AMBULATORY_CARE_PROVIDER_SITE_OTHER): Payer: Medicaid Other | Admitting: Obstetrics & Gynecology

## 2022-01-09 ENCOUNTER — Other Ambulatory Visit: Payer: Medicaid Other

## 2022-01-09 ENCOUNTER — Encounter: Payer: Self-pay | Admitting: Obstetrics & Gynecology

## 2022-01-09 VITALS — BP 124/77 | HR 94 | Wt 150.0 lb

## 2022-01-09 DIAGNOSIS — F17200 Nicotine dependence, unspecified, uncomplicated: Secondary | ICD-10-CM

## 2022-01-09 DIAGNOSIS — O0993 Supervision of high risk pregnancy, unspecified, third trimester: Secondary | ICD-10-CM

## 2022-01-09 DIAGNOSIS — Z3A30 30 weeks gestation of pregnancy: Secondary | ICD-10-CM

## 2022-01-09 DIAGNOSIS — O0933 Supervision of pregnancy with insufficient antenatal care, third trimester: Secondary | ICD-10-CM

## 2022-01-09 DIAGNOSIS — F1991 Other psychoactive substance use, unspecified, in remission: Secondary | ICD-10-CM

## 2022-01-09 NOTE — Progress Notes (Signed)
HIGH-RISK PREGNANCY VISIT Patient name: April [name] MRN [id number]  Date of birth: Jun 13, 1980 Chief Complaint:   Routine Prenatal Visit (Wants to discuss tubal ligation )  History of Present Illness:   April [name] is a 41 y.o. [phone] female at [redacted]w[redacted]d with an Estimated Date of Delivery: 03/18/22 being seen today for ongoing management of a high-risk pregnancy complicated by:  Elevated UAD -concern for FGR, [place] with MFM-dates were changed and growth was AGA (18%) Pt is scheduled for repeat US on 12/27 with MFM  -cocaine dependence -depression/anxiety -OUD- seen by Dr. Crissie Reese, on suboxone, Last Toxassure 12/13  Today she reports no complaints.   Contractions: Not present. Vag. Bleeding: None.  Movement: Present. denies leaking of fluid.      12/20/2021    2:29 PM 05/02/2020    9:43 AM  Depression screen PHQ 2/9  Decreased Interest 0 0  Down, Depressed, Hopeless 0 0  PHQ - 2 Score 0 0  Altered sleeping 0 1  Tired, decreased energy 0 1  Change in appetite 0 0  Feeling bad or failure about yourself  0 0  Trouble concentrating 0 0  Moving slowly or fidgety/restless 0 0  Suicidal thoughts 0 0  PHQ-9 Score 0 2     Current Outpatient Medications  Medication Instructions   acetaminophen (TYLENOL) 500 mg, Oral, Every 6 hours PRN   aspirin EC 162 mg, Oral, Daily   Buprenorphine HCl-Naloxone HCl (SUBOXONE) 4-1 MG FILM 1 Film, Sublingual, 2 times daily   naloxone (NARCAN) nasal spray 4 mg/0.1 mL Use as needed to reverse opioid overdose   Prenatal Vit-Fe Fumarate-FA (PRENATAL MULTIVITAMIN) TABS tablet 1 tablet, Oral, Daily     Review of Systems:   Pertinent items are noted in HPI Denies abnormal vaginal discharge w/ itching/odor/irritation, headaches, visual changes, shortness of breath, chest pain, abdominal pain, severe nausea/vomiting, or problems with urination or bowel movements unless otherwise stated above. Pertinent History Reviewed:  Reviewed past medical,surgical,  social, obstetrical and family history.  Reviewed problem list, medications and allergies. Physical Assessment:   Vitals:   01/09/22 1058  BP: 124/77  Pulse: 94  Weight: 150 lb (68 kg)  Body mass index is 25.75 kg/m.           Physical Examination:   General appearance: alert, well appearing, and in no distress  Mental status: normal mood, behavior, speech, dress, motor activity, and thought processes  Skin: warm & dry   Extremities: Edema: Trace    Cardiovascular: normal heart rate noted  Respiratory: normal respiratory effort, no distress  Abdomen: gravid, soft, non-tender  Pelvic: Cervical exam deferred         Fetal Status: Fetal Heart Rate (bpm): 140 Fundal Height: 26 cm Movement: Present    Fetal Surveillance Testing today: doppler   Chaperone: N/A    No results found for this or any previous visit (from the past 24 hour(s)).   Assessment & Plan:  High-risk pregnancy: [id number] at [redacted]w[redacted]d with an Estimated Date of Delivery: 03/18/22   -OUD Currently on suboxone, followed by Dr. Crissie Reese  -cocaine dependence -anxiety/depression- no meds currently -h/o FGR with concern for FGR/elevated dopplers Recent US showed AGA and dating was changed Repeat scheduled for 12/27   Meds: No orders of the defined types were placed in this encounter.   Labs/procedures today: doppler  Treatment Plan:  pending results of next [place]- if normal may cancel weekly testing.  Will continue growth every 4wks  Reviewed: Preterm labor  symptoms and general obstetric precautions including but not limited to vaginal bleeding, contractions, leaking of fluid and fetal movement were reviewed in detail with the patient.  All questions were answered. Pt has home bp cuff. Check bp weekly, let us know if >140/90.   Follow-up: Return for as scheduled.   Future Appointments  Date Time Provider Department Center  01/16/2022  1:30 PM [facility] NURSE [facility]  01/16/2022  1:45 PM [facility] US7 [facility]   01/23/2022  9:00 AM [facility] - FTOBGYN [place] [facility]-FT[facility]G None  01/23/2022  9:50 AM Autry-Lott, Randa Evens, DO C[facility]-FT FTOBGYN  01/28/2022 10:30 AM [facility] NURSE C[facility]-FT FTOBGYN  01/31/2022 11:30 AM [facility] - FTOBGYN [place] [facility] None  01/31/2022  1:30 PM Myna Hidalgo, DO C[facility]-FT FTOBGYN  02/04/2022  9:10 AM [facility]FTOBGYN NURSE C[facility]-FT FTOBGYN  02/07/2022 10:45 AM [facility] - FTOBGYN [place] [facility]-FT[facility]G None  02/07/2022 11:50 AM Lazaro Arms, MD C[facility]-FT FTOBGYN  02/11/2022  9:50 AM [facility]-FTOBGYN NURSE C[facility]-FT FTOBGYN  02/14/2022 10:00 AM [facility] - FTOBGYN [place] [facility]-FT[facility]G None  02/14/2022 11:10 AM Lazaro Arms, MD C[facility]-FT FTOBGYN  02/18/2022 10:30 AM [facility]FTOBGYN NURSE C[facility]-FT FTOBGYN  02/21/2022  9:15 AM [facility] - FTOBGYN [place] [facility]-FT[facility]G None  02/21/2022 10:10 AM Lazaro Arms, MD C[facility]-FT FTOBGYN    No orders of the defined types were placed in this encounter.   Myna Hidalgo, DO Attending Obstetrician & Gynecologist, [facility] for [employer], [facility]

## 2022-01-16 ENCOUNTER — Other Ambulatory Visit: Payer: Medicaid Other

## 2022-01-16 ENCOUNTER — Ambulatory Visit: Payer: Medicaid Other

## 2022-01-21 NOTE — L&D Delivery Note (Cosign Needed Addendum)
OB/GYN Faculty Practice Delivery Note  April [name] is a 42 y.o. [id number] s/p SVD at [redacted]w[redacted]d She was admitted for IOL.   ROM: 4h 235mith clear fluid GBS Status:  Negative/-- (01/30 1330) Maximum Maternal Temperature:  Temp (24hrs), Avg:97.6 F (36.4 C), Min:97.6 F (36.4 C), Max:97.6 F (36.4 C)    Labor Progress: Patient arrived at 3 cm dilation and was induced with AROM, pitocin.   Delivery Date/Time: 03/12/2022 at 1806 Delivery: Called to room and patient was complete and pushing. Head delivered in direct OA position. No nuchal cord present. Shoulder and body delivered in usual fashion. Infant with spontaneous cry, placed on mother's abdomen, dried and stimulated. Cord clamped x 2 after 1-minute delay, and cut by FOB. Cord blood drawn. Placenta delivered spontaneously with gentle cord traction. Fundus firm with massage and Pitocin. Labia, perineum, vagina, and cervix inspected with no laceration .   Placenta: spontaneous, intact, 3 vessel cord Complications: none Lacerations: none EBL: 36 mL Analgesia: Epidural    Infant: APGAR (1 MIN):  8 APGAR (5 MINS):  9 APGAR (10 MINS):    Weight: pending   ViGifford ShaveMD  OB Fellow  03/12/2022 6:33 PM

## 2022-01-22 ENCOUNTER — Other Ambulatory Visit (HOSPITAL_BASED_OUTPATIENT_CLINIC_OR_DEPARTMENT_OTHER): Payer: Self-pay | Admitting: Obstetrics & Gynecology

## 2022-01-22 DIAGNOSIS — Z3493 Encounter for supervision of normal pregnancy, unspecified, third trimester: Secondary | ICD-10-CM

## 2022-01-22 DIAGNOSIS — O365990 Maternal care for other known or suspected poor fetal growth, unspecified trimester, not applicable or unspecified: Secondary | ICD-10-CM

## 2022-01-23 ENCOUNTER — Ambulatory Visit (INDEPENDENT_AMBULATORY_CARE_PROVIDER_SITE_OTHER): Payer: Medicaid Other | Admitting: Family Medicine

## 2022-01-23 ENCOUNTER — Ambulatory Visit (INDEPENDENT_AMBULATORY_CARE_PROVIDER_SITE_OTHER): Payer: Medicaid Other

## 2022-01-23 ENCOUNTER — Encounter: Payer: Self-pay | Admitting: Family Medicine

## 2022-01-23 VITALS — BP 116/75 | HR 91 | Wt 147.0 lb

## 2022-01-23 DIAGNOSIS — O365990 Maternal care for other known or suspected poor fetal growth, unspecified trimester, not applicable or unspecified: Secondary | ICD-10-CM | POA: Diagnosis not present

## 2022-01-23 DIAGNOSIS — Z3A32 32 weeks gestation of pregnancy: Secondary | ICD-10-CM | POA: Diagnosis not present

## 2022-01-23 DIAGNOSIS — O0993 Supervision of high risk pregnancy, unspecified, third trimester: Secondary | ICD-10-CM

## 2022-01-23 DIAGNOSIS — Z3493 Encounter for supervision of normal pregnancy, unspecified, third trimester: Secondary | ICD-10-CM | POA: Diagnosis not present

## 2022-01-23 DIAGNOSIS — Z975 Presence of (intrauterine) contraceptive device: Secondary | ICD-10-CM

## 2022-01-23 DIAGNOSIS — F1991 Other psychoactive substance use, unspecified, in remission: Secondary | ICD-10-CM

## 2022-01-23 DIAGNOSIS — G5603 Carpal tunnel syndrome, bilateral upper limbs: Secondary | ICD-10-CM

## 2022-01-23 LAB — POCT URINALYSIS DIPSTICK OB
Blood, UA: NEGATIVE
Glucose, UA: NEGATIVE
Ketones, UA: NEGATIVE
Nitrite, UA: NEGATIVE
POC,PROTEIN,UA: NEGATIVE

## 2022-01-23 MED ORDER — BUPRENORPHINE HCL-NALOXONE HCL 4-1 MG SL FILM: ORAL_FILM | SUBLINGUAL | 0 refills | Status: DC

## 2022-01-23 NOTE — Progress Notes (Addendum)
   PRENATAL VISIT NOTE  Subjective:  April [name] is a 42 y.o. [phone] at [redacted]w[redacted]d being seen today for ongoing prenatal care.  She is currently monitored for the following issues for this high-risk pregnancy and has MDD (major depressive disorder); Cocaine dependence with cocaine-induced mood disorder ([place]); Late prenatal care; History of drug use; History of postpartum hypertension; Depression with anxiety; Smoker; Cystic fibrosis carrier; History of prior pregnancy with IUGR newborn; History of precipitous labor and delivery; High-risk pregnancy; Fetal growth restriction antepartum; IUD (intrauterine device) in place; and Opioid use disorder in remission on their problem list.  Patient reports  bilateral tingling and numbness of each middle and ring finger at night. Sometimes she wakes up and shakes them and they feel better.  Contractions: Not present. Vag. Bleeding: None.  Movement: Present. Denies leaking of fluid.   The following portions of the patient's history were reviewed and updated as appropriate: allergies, current medications, past family history, past medical history, past social history, past surgical history and problem list.   Objective:   Vitals:   01/23/22 0948  BP: 116/75  Pulse: 91  Weight: 147 lb (66.7 kg)    Fetal Status:     Movement: Present   FHR: 152 on [place] today  General:  Alert, oriented and cooperative. Patient is in no acute distress.  Skin: Skin is warm and dry. No rash noted.   Cardiovascular: Normal heart rate noted  Respiratory: Normal respiratory effort, no problems with respiration noted  Abdomen: Soft, gravid, appropriate for gestational age.  Pain/Pressure: Absent     Pelvic: Cervical exam deferred        Extremities: Normal range of motion.     Mental Status: Normal mood and affect. Normal behavior. Normal judgment and thought content.   Assessment and Plan:  Pregnancy: [id number] at [redacted]w[redacted]d 1. High-risk pregnancy in third trimester Doing well.  Acute concern below.   2. Bilateral carpal tunnel syndrome Encouraged night splints and follow up with worsening symptoms. Ultimate tx and symptom relief likely to ensue after delivery.   3. History of drug use BPP 8/8 and [place] reviewed, EFW 22%. Discussed patient with Dr. Dione Plover to refill suboxone rx today and will have follow up with Dr. Nelda Marseille - ToxASSURE Select 13 (MW), Urine  4. [redacted] weeks gestation of pregnancy   Preterm labor symptoms and general obstetric precautions including but not limited to vaginal bleeding, contractions, leaking of fluid and fetal movement were reviewed in detail with the patient. Please refer to After Visit Summary for other counseling recommendations.   No follow-ups on file.  Future Appointments  Date Time Provider [place]  01/28/2022 10:30 AM [facility] NURSE [facility] FTOBGYN  01/31/2022 11:30 AM [facility] - FTOBGYN [place] [facility]FT[facility] None  01/31/2022  1:30 PM Janyth Pupa, DO C[facility]-FT FTOBGYN  02/04/2022  9:10 AM [facility]FTOBGYN NURSE C[facility]-FT FTOBGYN  02/07/2022 10:45 AM [facility] - FTOBGYN [place] [facility]FT[facility] None  02/07/2022 11:50 AM Florian Buff, MD C[facility]-FT FTOBGYN  02/11/2022  9:50 AM [facility]GY[facility] NURSE C[facility]-FT FTOBGYN  02/14/2022 10:00 AM [facility] - FTOBGYN [place] [facility] None  02/14/2022 11:10 AM Florian Buff, MD C[facility]-FT FTOBGYN  02/18/2022 10:30 AM [facility]GY[facility] NURSE C[facility]-FT FTOBGYN  02/21/2022  9:15 AM [facility] - FTOBGYN [place] [facility]-FT[facility] None  02/21/2022 10:10 AM Florian Buff, MD C[facility]-FT FTOBGYN    Rhone Ozaki Autry-Lott, DO

## 2022-01-23 NOTE — Progress Notes (Signed)
[place]orea 56+2 wks,cephalic,BPP 5/6,LS 2.9 cm,posterior placenta gr 3,AFI 12.7 cm,FHR 152 bpm,EFW 1806 g 22%,BPP 8/8

## 2022-01-24 ENCOUNTER — Telehealth: Payer: Self-pay | Admitting: Neonatology

## 2022-01-27 LAB — TOXASSURE SELECT 13 (MW), URINE

## 2022-01-28 ENCOUNTER — Ambulatory Visit (INDEPENDENT_AMBULATORY_CARE_PROVIDER_SITE_OTHER): Payer: Medicaid Other | Admitting: *Deleted

## 2022-01-28 VITALS — BP 116/67 | HR 95 | Wt 148.0 lb

## 2022-01-28 DIAGNOSIS — Z3A33 33 weeks gestation of pregnancy: Secondary | ICD-10-CM | POA: Diagnosis not present

## 2022-01-28 DIAGNOSIS — O0993 Supervision of high risk pregnancy, unspecified, third trimester: Secondary | ICD-10-CM | POA: Diagnosis not present

## 2022-01-28 DIAGNOSIS — O365990 Maternal care for other known or suspected poor fetal growth, unspecified trimester, not applicable or unspecified: Secondary | ICD-10-CM | POA: Diagnosis not present

## 2022-01-28 NOTE — Progress Notes (Signed)
   NURSE VISIT- NST  SUBJECTIVE:  April [name] is a 42 y.o. [phone] female at [redacted]w[redacted]d, here for a NST for pregnancy complicated by Elevated dopplers and FGR.  She reports active fetal movement, contractions: none, vaginal bleeding: none, membranes: intact.   OBJECTIVE:  BP 116/67   Pulse 95   Wt 148 lb (67.1 kg)   LMP 06/05/2021   BMI 25.40 kg/m   Appears well, no apparent distress  No results found for this or any previous visit (from the past 24 hour(s)).  NST: FHR baseline 135 bpm, Variability: moderate, Accelerations:present, Decelerations:  Absent= Cat 1/reactive Toco: none   ASSESSMENT: [id number] at [redacted]w[redacted]d with Elevated dopplers and FGR NST reactive  PLAN: EFM strip reviewed by Knute Neu, CNM, [facility]   Recommendations: keep next appointment as scheduled    Alice Rieger  01/28/2022 12:50 PM

## 2022-01-31 ENCOUNTER — Other Ambulatory Visit: Payer: Medicaid Other

## 2022-01-31 ENCOUNTER — Ambulatory Visit (INDEPENDENT_AMBULATORY_CARE_PROVIDER_SITE_OTHER): Payer: Medicaid Other | Admitting: Obstetrics & Gynecology

## 2022-01-31 ENCOUNTER — Encounter: Payer: Self-pay | Admitting: Obstetrics & Gynecology

## 2022-01-31 VITALS — BP 119/68 | HR 89 | Wt 146.0 lb

## 2022-01-31 DIAGNOSIS — F1991 Other psychoactive substance use, unspecified, in remission: Secondary | ICD-10-CM

## 2022-01-31 DIAGNOSIS — O0993 Supervision of high risk pregnancy, unspecified, third trimester: Secondary | ICD-10-CM

## 2022-01-31 DIAGNOSIS — Z3A33 33 weeks gestation of pregnancy: Secondary | ICD-10-CM

## 2022-01-31 DIAGNOSIS — F1190 Opioid use, unspecified, uncomplicated: Secondary | ICD-10-CM

## 2022-01-31 DIAGNOSIS — F17200 Nicotine dependence, unspecified, uncomplicated: Secondary | ICD-10-CM

## 2022-01-31 MED ORDER — BUPRENORPHINE HCL-NALOXONE HCL 4-1 MG SL FILM: ORAL_FILM | SUBLINGUAL | 0 refills | Status: DC

## 2022-01-31 NOTE — Progress Notes (Signed)
HIGH-RISK PREGNANCY VISIT Patient name: April [name] MRN [id number]  Date of birth: 1980-04-02 Chief Complaint:   Routine Prenatal Visit  History of Present Illness:   April [name] is a 42 y.o. [id number] female at [redacted]w[redacted]d with an Estimated Date of Delivery: 03/18/22 being seen today for ongoing management of a high-risk pregnancy complicated by:  -OUD Doing well with current medication -concern for FGR- due date changes, last growth 22%.    Today she reports no complaints.   Contractions: Not present. Vag. Bleeding: None.  Movement: Present. denies leaking of fluid.      12/20/2021    2:29 PM 05/02/2020    9:43 AM  Depression screen PHQ 2/9  Decreased Interest 0 0  Down, Depressed, Hopeless 0 0  PHQ - 2 Score 0 0  Altered sleeping 0 1  Tired, decreased energy 0 1  Change in appetite 0 0  Feeling bad or failure about yourself  0 0  Trouble concentrating 0 0  Moving slowly or fidgety/restless 0 0  Suicidal thoughts 0 0  PHQ-9 Score 0 2     Current Outpatient Medications  Medication Instructions   acetaminophen (TYLENOL) 500 mg, Oral, Every 6 hours PRN   aspirin EC 162 mg, Oral, Daily   Buprenorphine HCl-Naloxone HCl (SUBOXONE) 4-1 MG FILM Place 1 Film under the tongue 2 (two) times daily for 14 days   naloxone (NARCAN) nasal spray 4 mg/0.1 mL Use as needed to reverse opioid overdose   Prenatal Vit-Fe Fumarate-FA (PRENATAL MULTIVITAMIN) TABS tablet 1 tablet, Oral, Daily     Review of Systems:   Pertinent items are noted in HPI Denies abnormal vaginal discharge w/ itching/odor/irritation, headaches, visual changes, shortness of breath, chest pain, abdominal pain, severe nausea/vomiting, or problems with urination or bowel movements unless otherwise stated above. Pertinent History Reviewed:  Reviewed past medical,surgical, social, obstetrical and family history.  Reviewed problem list, medications and allergies. Physical Assessment:   Vitals:   01/31/22 1335  BP:  119/68  Pulse: 89  Weight: 146 lb (66.2 kg)  Body mass index is 25.06 kg/m.           Physical Examination:   General appearance: alert, well appearing, and in no distress  Mental status: normal mood, behavior, speech, dress, motor activity, and thought processes  Skin: warm & dry   Extremities:      Cardiovascular: normal heart rate noted  Respiratory: normal respiratory effort, no distress  Abdomen: gravid, soft, non-tender  Pelvic: Cervical exam deferred         Fetal Status: Fetal Heart Rate (bpm): 140 Fundal Height: 28 cm Movement: Present    Fetal Surveillance Testing today: doppler   Chaperone: N/A    No results found for this or any previous visit (from the past 24 hour(s)).   Assessment & Plan:  High-risk pregnancy: [id number] at [redacted]w[redacted]d with an Estimated Date of Delivery: 03/18/22   1) OUD -continue with current medication -plan for growth q 4wks -continue antepartum testing  2) small for dates- with concern for possible FGR -will plan to continue with weekly NST -growth next available  Meds: No orders of the defined types were placed in this encounter.   Labs/procedures today: doppler  Treatment Plan:  as outlined above  Reviewed: Preterm labor symptoms and general obstetric precautions including but not limited to vaginal bleeding, contractions, leaking of fluid and fetal movement were reviewed in detail with the patient.  All questions were answered. Pt has home bp cuff.  Check bp weekly, let us know if >140/90.   Follow-up: Return in about 1 week (around 02/07/2022) for [place] visit.   Future Appointments  Date Time Provider [place]  02/04/2022  9:10 AM [facility] NURSE [facility] FTOBGYN  02/07/2022 11:50 AM Florian Buff, MD C[facility]-FT FTOBGYN  02/11/2022  9:50 AM [facility] NURSE [facility] FTOBGYN  02/14/2022 11:10 AM Florian Buff, MD C[facility]-FT FTOBGYN  02/18/2022 10:30 AM [facility] NURSE [facility]-FT FTOBGYN  02/21/2022  9:15 AM [facility] - FTOBGYN [place] C[facility]-FT[facility]G None   02/21/2022 10:10 AM Florian Buff, MD C[facility]-FT FTOBGYN    No orders of the defined types were placed in this encounter.   Janyth Pupa, DO Attending [place], [facility] for [employer], [place]

## 2022-01-31 NOTE — Addendum Note (Signed)
Addended by: Annalee Genta on: 01/31/2022 02:07 PM   Modules accepted: Orders

## 2022-02-04 ENCOUNTER — Ambulatory Visit (INDEPENDENT_AMBULATORY_CARE_PROVIDER_SITE_OTHER): Payer: Medicaid Other | Admitting: *Deleted

## 2022-02-04 VITALS — BP 110/68 | HR 81 | Wt 144.0 lb

## 2022-02-04 DIAGNOSIS — Z3A34 34 weeks gestation of pregnancy: Secondary | ICD-10-CM

## 2022-02-04 DIAGNOSIS — O365990 Maternal care for other known or suspected poor fetal growth, unspecified trimester, not applicable or unspecified: Secondary | ICD-10-CM

## 2022-02-04 DIAGNOSIS — O0993 Supervision of high risk pregnancy, unspecified, third trimester: Secondary | ICD-10-CM | POA: Diagnosis not present

## 2022-02-04 NOTE — Progress Notes (Signed)
   NURSE VISIT- NST  SUBJECTIVE:  April [name] is a 42 y.o. [phone] female at [redacted]w[redacted]d, here for a NST for pregnancy complicated by FGR.  She reports active fetal movement, contractions: none, vaginal bleeding: none, membranes: intact.   OBJECTIVE:  BP 110/68   Pulse 81   Wt 144 lb (65.3 kg)   LMP 06/05/2021   BMI 24.72 kg/m   Appears well, no apparent distress  No results found for this or any previous visit (from the past 24 hour(s)).  NST: FHR baseline 135 bpm, Variability: moderate, Accelerations:present, Decelerations:  Absent= Cat 1/reactive Toco: none   ASSESSMENT: [id number] at [redacted]w[redacted]d with FGR NST reactive  PLAN: EFM strip reviewed by Dr. Elonda Husky   Recommendations: keep next appointment as scheduled    Alice Rieger  02/04/2022 10:13 AM

## 2022-02-05 ENCOUNTER — Telehealth: Payer: Self-pay

## 2022-02-05 NOTE — Telephone Encounter (Signed)
I had to send a message on another message link. It wouldn't let me send her one after my Epic went out.

## 2022-02-07 ENCOUNTER — Ambulatory Visit: Payer: Medicaid Other | Admitting: Obstetrics & Gynecology

## 2022-02-07 ENCOUNTER — Other Ambulatory Visit: Payer: Medicaid Other

## 2022-02-07 VITALS — BP 125/74 | HR 82 | Wt 145.0 lb

## 2022-02-07 DIAGNOSIS — O0993 Supervision of high risk pregnancy, unspecified, third trimester: Secondary | ICD-10-CM

## 2022-02-07 DIAGNOSIS — F1190 Opioid use, unspecified, uncomplicated: Secondary | ICD-10-CM

## 2022-02-07 NOTE — Progress Notes (Incomplete)
HIGH-RISK PREGNANCY VISIT Patient name: April [name] MRN [id number]  Date of birth: [birth year] Chief Complaint:   Routine Prenatal Visit  History of Present Illness:   April [name] is a 42 y.o. [id number] female at [redacted]w[redacted]d with an Estimated Date of Delivery: 03/18/22 being seen today for ongoing management of a high-risk pregnancy complicated by Hx FGR, ?FGR vs different EDD, subutex.    Today she reports no complaints. Contractions: Not present. Vag. Bleeding: None.  Movement: Present. denies leaking of fluid.      12/20/2021    2:29 PM 05/02/2020    9:43 AM  Depression screen PHQ 2/9  Decreased Interest 0 0  Down, Depressed, Hopeless 0 0  PHQ - 2 Score 0 0  Altered sleeping 0 1  Tired, decreased energy 0 1  Change in appetite 0 0  Feeling bad or failure about yourself  0 0  Trouble concentrating 0 0  Moving slowly or fidgety/restless 0 0  Suicidal thoughts 0 0  PHQ-9 Score 0 2        12/20/2021    2:43 PM 05/02/2020    9:43 AM  GAD 7 : Generalized Anxiety Score  Nervous, Anxious, on Edge 1 0  Control/stop worrying 1 1  Worry too much - different things 0 1  Trouble relaxing 1 1  Restless 0 0  Easily annoyed or irritable 1 1  Afraid - awful might happen 1 1  Total GAD 7 Score 5 5     Review of Systems:   Pertinent items are noted in HPI Denies abnormal vaginal discharge w/ itching/odor/irritation, headaches, visual changes, shortness of breath, chest pain, abdominal pain, severe nausea/vomiting, or problems with urination or bowel movements unless otherwise stated above. Pertinent History Reviewed:  Reviewed past medical,surgical, social, obstetrical and family history.  Reviewed problem list, medications and allergies. Physical Assessment:   Vitals:   02/07/22 1155  BP: 125/74  Pulse: 82  Weight: 145 lb (65.8 kg)  Body mass index is 24.89 kg/m.           Physical Examination:   General appearance: alert, well appearing, and in no distress  Mental  status: alert, oriented to person, place, and time  Skin: warm & dry   Extremities: Edema: None    Cardiovascular: normal heart rate noted  Respiratory: normal respiratory effort, no distress  Abdomen: gravid, soft, non-tender  Pelvic: Cervical exam deferred         Fetal Status:     Movement: Present    Fetal Surveillance Testing today: FHR 140   Chaperone: N/A    No results found for this or any previous visit (from the past 24 hour(s)).  Assessment & Plan:  High-risk pregnancy: [id number] at [redacted]w[redacted]d with an Estimated Date of Delivery: 03/18/22      ICD-10-CM   1. High-risk pregnancy in third trimester  O09.93     2. Opioid use disorder  F11.90          Meds: No orders of the defined types were placed in this encounter.   Orders: No orders of the defined types were placed in this encounter.    Labs/procedures today: {ob lab/procedures:25214}  Treatment Plan:  ***  Reviewed: {Blank single:19197::"Term","Preterm"} labor symptoms and general obstetric precautions including but not limited to vaginal bleeding, contractions, leaking of fluid and fetal movement were reviewed in detail with the patient.  All questions were answered. {does does not:25387::"Does"} have home bp cuff. Office bp cuff given: {yes/no/default  P/V:37482::"LMB applicable"}. Check bp {weekly daily:25388::"weekly"}, let us know if consistently {pregnant bp:25389::">140 and/or >90"}.  Follow-up: No follow-ups on file.   Future Appointments  Date Time Provider [place]  02/12/2022  3:00 PM [facility] [place] [facility] None  02/12/2022  4:10 PM Florian Buff, MD [facility]  02/19/2022 10:50 AM [facility] NURSE [facility]  02/19/2022 11:10 AM Florian Buff, MD [facility]  02/26/2022 10:10 AM [facility] NURSE [facility]  02/26/2022 10:30 AM Chancy Milroy, MD [facility]  03/05/2022 11:10 AM [facility] NURSE [facility]  03/05/2022 11:30 AM Chancy Milroy, MD [facility]   03/12/2022  1:30 PM [place] [facility] [place] [facility] None  03/12/2022  2:30 PM Florian Buff, MD [facility]    No orders of the defined types were placed in this encounter.  Florian Buff  Attending Physician for the [facility] 02/07/2022 12:15 PM

## 2022-02-11 ENCOUNTER — Other Ambulatory Visit: Payer: Medicaid Other

## 2022-02-11 ENCOUNTER — Other Ambulatory Visit: Payer: Self-pay | Admitting: Obstetrics & Gynecology

## 2022-02-11 DIAGNOSIS — O09299 Supervision of pregnancy with other poor reproductive or obstetric history, unspecified trimester: Secondary | ICD-10-CM

## 2022-02-11 DIAGNOSIS — O09523 Supervision of elderly multigravida, third trimester: Secondary | ICD-10-CM

## 2022-02-12 ENCOUNTER — Ambulatory Visit (INDEPENDENT_AMBULATORY_CARE_PROVIDER_SITE_OTHER): Payer: Medicaid Other

## 2022-02-12 ENCOUNTER — Ambulatory Visit (INDEPENDENT_AMBULATORY_CARE_PROVIDER_SITE_OTHER): Payer: Medicaid Other | Admitting: Obstetrics & Gynecology

## 2022-02-12 ENCOUNTER — Encounter: Payer: Self-pay | Admitting: Obstetrics & Gynecology

## 2022-02-12 VITALS — BP 123/80 | HR 78 | Wt 146.0 lb

## 2022-02-12 DIAGNOSIS — O09293 Supervision of pregnancy with other poor reproductive or obstetric history, third trimester: Secondary | ICD-10-CM

## 2022-02-12 DIAGNOSIS — F1190 Opioid use, unspecified, uncomplicated: Secondary | ICD-10-CM

## 2022-02-12 DIAGNOSIS — O09523 Supervision of elderly multigravida, third trimester: Secondary | ICD-10-CM | POA: Diagnosis not present

## 2022-02-12 DIAGNOSIS — Z3A35 35 weeks gestation of pregnancy: Secondary | ICD-10-CM

## 2022-02-12 DIAGNOSIS — O365990 Maternal care for other known or suspected poor fetal growth, unspecified trimester, not applicable or unspecified: Secondary | ICD-10-CM

## 2022-02-12 DIAGNOSIS — O0993 Supervision of high risk pregnancy, unspecified, third trimester: Secondary | ICD-10-CM

## 2022-02-12 DIAGNOSIS — O09299 Supervision of pregnancy with other poor reproductive or obstetric history, unspecified trimester: Secondary | ICD-10-CM

## 2022-02-12 DIAGNOSIS — Z975 Presence of (intrauterine) contraceptive device: Secondary | ICD-10-CM

## 2022-02-12 NOTE — Progress Notes (Signed)
[place] 35+1 wks,frank breech,cx 3.6 cm,posterior placenta gr 3,AFI 18.6 cm,FHR 137 bpm,EFW 2297 g 17.3 %,FL 1.6%

## 2022-02-12 NOTE — Progress Notes (Signed)
HIGH-RISK PREGNANCY VISIT Patient name: April [name] MRN [id number]  Date of birth: 11-26-1980 Chief Complaint:   Routine Prenatal Visit, High Risk Gestation, and Pregnancy Ultrasound  History of Present Illness:   April [name] is a 42 y.o. [id number] female at [redacted]w[redacted]d with an Estimated Date of Delivery: 03/18/22 being seen today for ongoing management of a high-risk pregnancy complicated by AMA OAD.    Today she reports no complaints. Contractions: Not present.  .  Movement: Present. denies leaking of fluid.      12/20/2021    2:29 PM 05/02/2020    9:43 AM  Depression screen PHQ 2/9  Decreased Interest 0 0  Down, Depressed, Hopeless 0 0  PHQ - 2 Score 0 0  Altered sleeping 0 1  Tired, decreased energy 0 1  Change in appetite 0 0  Feeling bad or failure about yourself  0 0  Trouble concentrating 0 0  Moving slowly or fidgety/restless 0 0  Suicidal thoughts 0 0  PHQ-9 Score 0 2        12/20/2021    2:43 PM 05/02/2020    9:43 AM  GAD 7 : Generalized Anxiety Score  Nervous, Anxious, on Edge 1 0  Control/stop worrying 1 1  Worry too much - different things 0 1  Trouble relaxing 1 1  Restless 0 0  Easily annoyed or irritable 1 1  Afraid - awful might happen 1 1  Total GAD 7 Score 5 5     Review of Systems:   Pertinent items are noted in HPI Denies abnormal vaginal discharge w/ itching/odor/irritation, headaches, visual changes, shortness of breath, chest pain, abdominal pain, severe nausea/vomiting, or problems with urination or bowel movements unless otherwise stated above. Pertinent History Reviewed:  Reviewed past medical,surgical, social, obstetrical and family history.  Reviewed problem list, medications and allergies. Physical Assessment:   Vitals:   02/12/22 1547  BP: 123/80  Pulse: 78  Weight: 146 lb (66.2 kg)  Body mass index is 25.06 kg/m.           Physical Examination:   General appearance: alert, well appearing, and in no distress  Mental status:  alert, oriented to person, place, and time  Skin: warm & dry   Extremities: Edema: Trace    Cardiovascular: normal heart rate noted  Respiratory: normal respiratory effort, no distress  Abdomen: gravid, soft, non-tender  Pelvic: Cervical exam deferred         Fetal Status:     Movement: Present    Fetal Surveillance Testing today: EFW 17%   Chaperone: N/A    No results found for this or any previous visit (from the past 24 hour(s)).  Assessment & Plan:  High-risk pregnancy: [id number] at [redacted]w[redacted]d with an Estimated Date of Delivery: 03/18/22      ICD-10-CM   1. High-risk pregnancy in third trimester  O09.93     2. Opioid use disorder  F11.90         Meds: No orders of the defined types were placed in this encounter.   Orders: No orders of the defined types were placed in this encounter.    Labs/procedures today: U/S  Treatment Plan:  per plan   Follow-up: Return for keep scheduled.   Future Appointments  Date Time Provider [place]  02/19/2022 10:50 AM [facility] NURSE [facility]  02/19/2022 11:10 AM Florian Buff, MD [facility]  02/26/2022 10:10 AM [facility] NURSE [facility]  02/26/2022 10:30 AM Arlina Robes  L, MD [facility]  03/05/2022 11:10 AM [facility] NURSE [facility]  03/05/2022 11:30 AM Chancy Milroy, MD [facility]  03/12/2022  1:30 PM [place] - [facility] [place] [facility] None  03/12/2022  2:30 PM Florian Buff, MD [facility]    No orders of the defined types were placed in this encounter.  Florian Buff  Attending Physician for [facility] 02/12/2022 4:27 PM

## 2022-02-14 ENCOUNTER — Other Ambulatory Visit: Payer: Medicaid Other

## 2022-02-14 ENCOUNTER — Encounter: Payer: Medicaid Other | Admitting: Obstetrics & Gynecology

## 2022-02-18 ENCOUNTER — Other Ambulatory Visit: Payer: Medicaid Other

## 2022-02-19 ENCOUNTER — Ambulatory Visit (INDEPENDENT_AMBULATORY_CARE_PROVIDER_SITE_OTHER): Payer: Medicaid Other | Admitting: Obstetrics & Gynecology

## 2022-02-19 ENCOUNTER — Other Ambulatory Visit (HOSPITAL_COMMUNITY)
Admission: RE | Admit: 2022-02-19 | Discharge: 2022-02-19 | Disposition: A | Discharge status: Home / Self Care | Payer: Medicaid Other | Source: Ambulatory Visit | Attending: Obstetrics & Gynecology | Admitting: Obstetrics & Gynecology

## 2022-02-19 ENCOUNTER — Other Ambulatory Visit: Payer: Medicaid Other

## 2022-02-19 VITALS — BP 127/80 | HR 88 | Wt 144.0 lb

## 2022-02-19 DIAGNOSIS — O0993 Supervision of high risk pregnancy, unspecified, third trimester: Secondary | ICD-10-CM | POA: Insufficient documentation

## 2022-02-19 DIAGNOSIS — F1190 Opioid use, unspecified, uncomplicated: Secondary | ICD-10-CM | POA: Diagnosis not present

## 2022-02-19 DIAGNOSIS — Z3A36 36 weeks gestation of pregnancy: Secondary | ICD-10-CM | POA: Insufficient documentation

## 2022-02-19 LAB — OB RESULTS CONSOLE GC/CHLAMYDIA
Chlamydia: NEGATIVE
Neisseria Gonorrhea: NEGATIVE

## 2022-02-19 LAB — OB RESULTS CONSOLE GBS: GBS: NEGATIVE

## 2022-02-19 NOTE — Progress Notes (Signed)
HIGH-RISK PREGNANCY VISIT Patient name: April [name] MRN [id number]  Date of birth: Jun 04, 1980 Chief Complaint:   Routine Prenatal Visit and Non-stress Test  History of Present Illness:   April [name] is a 42 y.o. Q2V9563 female at [redacted]w[redacted]d  with an Estimated Date of Delivery: 03/18/22 being seen today for ongoing management of a high-risk pregnancy complicated by AMA OAD.    Today she reports no complaints. Contractions: Not present. Vag. Bleeding: None.  Movement: Present. denies leaking of fluid.      12/20/2021    2:29 PM 05/02/2020    9:43 AM  Depression screen PHQ 2/9  Decreased Interest 0 0  Down, Depressed, Hopeless 0 0  PHQ - 2 Score 0 0  Altered sleeping 0 1  Tired, decreased energy 0 1  Change in appetite 0 0  Feeling bad or failure about yourself  0 0  Trouble concentrating 0 0  Moving slowly or fidgety/restless 0 0  Suicidal thoughts 0 0  PHQ-9 Score 0 2        12/20/2021    2:43 PM 05/02/2020    9:43 AM  GAD 7 : Generalized Anxiety Score  Nervous, Anxious, on Edge 1 0  Control/stop worrying 1 1  Worry too much - different things 0 1  Trouble relaxing 1 1  Restless 0 0  Easily annoyed or irritable 1 1  Afraid - awful might happen 1 1  Total GAD 7 Score 5 5     Review of Systems:   Pertinent items are noted in HPI Denies abnormal vaginal discharge w/ itching/odor/irritation, headaches, visual changes, shortness of breath, chest pain, abdominal pain, severe nausea/vomiting, or problems with urination or bowel movements unless otherwise stated above. Pertinent History Reviewed:  Reviewed past medical,surgical, social, obstetrical and family history.  Reviewed problem list, medications and allergies. Physical Assessment:   Vitals:   02/19/22 1101  BP: 127/80  Pulse: 88  Weight: 144 lb (65.3 kg)  Body mass index is 24.72 kg/m.           Physical Examination:   General appearance: alert, well appearing, and in no distress  Mental status: alert,  oriented to person, place, and time  Skin: warm & dry   Extremities: Edema: None    Cardiovascular: normal heart rate noted  Respiratory: normal respiratory effort, no distress  Abdomen: gravid, soft, non-tender  Pelvic: Cervical exam deferred         Fetal Status:     Movement: Present    Fetal Surveillance Testing today: EFW 17%   Chaperone: N/A    No results found for this or any previous visit (from the past 24 hour(s)).  Assessment & Plan:  High-risk pregnancy: [id number] at [redacted]w[redacted]d with an Estimated Date of Delivery: 03/18/22      ICD-10-CM   1. [redacted] weeks gestation of pregnancy  Z3A.36 Cervicovaginal ancillary only( [place])    Culture, beta strep (group b only)    2. High-risk pregnancy in third trimester  O09.93     3. Opioid use disorder  F11.90         Meds: No orders of the defined types were placed in this encounter.   Orders:  Orders Placed This Encounter  Procedures   Culture, beta strep (group b only)     Labs/procedures today: reactive NST  April [name] is at [redacted]w[redacted]d Estimated Date of Delivery: 03/18/22  NST being performed due to OAD  Today the NST is Reactive  Fetal Monitoring:  Baseline: 140 bpm, Variability: Good {> 6 bpm), Accelerations: Reactive, and Decelerations: Absent   reactive  The accelerations are >15 bpm and more than 2 in 20 minutes  Final diagnosis:  Reactive NST  Florian Buff, MD    Treatment Plan:  per plan   Follow-up: Return for keep scheduled.   Future Appointments  Date Time Provider [place]  02/26/2022 10:10 AM [facility] NURSE [facility]  02/26/2022 10:30 AM Chancy Milroy, MD [facility]  03/05/2022 11:10 AM [facility] NURSE [facility]  03/05/2022 11:30 AM Chancy Milroy, MD [facility]  03/12/2022  1:30 PM [place] - [facility] [place] [facility] None  03/12/2022  2:30 PM April [name], Mertie Clause, MD [facility]    Orders Placed This Encounter  Procedures   Culture, beta strep (group b only)   Florian Buff  Attending Physician for [facility] 02/19/2022 11:42 AM

## 2022-02-20 LAB — CERVICOVAGINAL ANCILLARY ONLY
Chlamydia: NEGATIVE
Comment: NEGATIVE
Comment: NORMAL
Neisseria Gonorrhea: NEGATIVE

## 2022-02-21 ENCOUNTER — Other Ambulatory Visit: Payer: Medicaid Other

## 2022-02-21 ENCOUNTER — Encounter: Payer: Medicaid Other | Admitting: Obstetrics & Gynecology

## 2022-02-23 LAB — CULTURE, BETA STREP (GROUP B ONLY): Strep Gp B Culture: NEGATIVE

## 2022-02-26 ENCOUNTER — Ambulatory Visit (INDEPENDENT_AMBULATORY_CARE_PROVIDER_SITE_OTHER): Payer: Medicaid Other | Admitting: Obstetrics and Gynecology

## 2022-02-26 ENCOUNTER — Encounter: Payer: Self-pay | Admitting: Obstetrics and Gynecology

## 2022-02-26 ENCOUNTER — Other Ambulatory Visit: Payer: Medicaid Other

## 2022-02-26 VITALS — BP 126/86 | HR 87 | Wt 145.0 lb

## 2022-02-26 DIAGNOSIS — O365930 Maternal care for other known or suspected poor fetal growth, third trimester, not applicable or unspecified: Secondary | ICD-10-CM

## 2022-02-26 DIAGNOSIS — Z3A37 37 weeks gestation of pregnancy: Secondary | ICD-10-CM

## 2022-02-26 DIAGNOSIS — O0993 Supervision of high risk pregnancy, unspecified, third trimester: Secondary | ICD-10-CM | POA: Diagnosis not present

## 2022-02-26 DIAGNOSIS — Z8759 Personal history of other complications of pregnancy, childbirth and the puerperium: Secondary | ICD-10-CM

## 2022-02-26 DIAGNOSIS — O365990 Maternal care for other known or suspected poor fetal growth, unspecified trimester, not applicable or unspecified: Secondary | ICD-10-CM

## 2022-02-26 NOTE — Progress Notes (Signed)
Subjective:  April [name] is a 42 y.o. [phone] at [redacted]w[redacted]d being seen today for ongoing prenatal care.  She is currently monitored for the following issues for this high-risk pregnancy and has MDD (major depressive disorder); Cocaine dependence with cocaine-induced mood disorder ([place]); Late prenatal care; History of drug use; History of postpartum hypertension; Depression with anxiety; Smoker; Cystic fibrosis carrier; History of prior pregnancy with IUGR newborn; History of precipitous labor and delivery; High-risk pregnancy; Fetal growth restriction antepartum; and Opioid use disorder in remission on their problem list.  Patient reports  general discomforts of pregnancy .  Contractions: Not present.  .  Movement: Present. Denies leaking of fluid.   The following portions of the patient's history were reviewed and updated as appropriate: allergies, current medications, past family history, past medical history, past social history, past surgical history and problem list. Problem list updated.  Objective:   Vitals:   02/26/22 1049  BP: 126/86  Pulse: 87  Weight: 145 lb (65.8 kg)    Fetal Status:     Movement: Present     General:  Alert, oriented and cooperative. Patient is in no acute distress.  Skin: Skin is warm and dry. No rash noted.   Cardiovascular: Normal heart rate noted  Respiratory: Normal respiratory effort, no problems with respiration noted  Abdomen: Soft, gravid, appropriate for gestational age. Pain/Pressure: Absent     Pelvic:  Cervical exam deferred        Extremities: Normal range of motion.  Edema: Trace  Mental Status: Normal mood and affect. Normal behavior. Normal judgment and thought content.   Urinalysis:      Assessment and Plan:  Pregnancy: [id number] at [redacted]w[redacted]d  1. History of prior pregnancy with IUGR newborn Stable  2. High-risk pregnancy in third trimester Labor precautions   3. Fetal growth restriction antepartum Serial growth scans and weekly  testing  NST: Baseline 120's, + accels > 10 bmp, no decels or ut ctx. Reactive  Term labor symptoms and general obstetric precautions including but not limited to vaginal bleeding, contractions, leaking of fluid and fetal movement were reviewed in detail with the patient. Please refer to After Visit Summary for other counseling recommendations.  Return in about 1 week (around 03/05/2022) for OB visit, face to face, MD only.   Chancy Milroy, MD

## 2022-02-26 NOTE — Patient Instructions (Signed)
Vaginal Delivery  Vaginal delivery means that you give birth by pushing your baby out of your birth canal (vagina). Your health care team will help you before, during, and after vaginal delivery. Birth experiences are unique for every woman and every pregnancy, and birth experiences vary depending on where you choose to give birth. What are the risks and benefits? Generally, this is safe. However, problems may occur, including: Bleeding. Infection. Damage to other structures such as vaginal tearing. Allergic reactions to medicines. Despite the risks, benefits of vaginal delivery include less risk of bleeding and infection and a shorter recovery time compared to a Cesarean delivery. Cesarean delivery, or C-section, is the surgical delivery of a baby. What happens when I arrive at the birth center or hospital? Once you are in labor and have been admitted into the hospital or birth center, your health care team may: Review your pregnancy history and any concerns that you have. Talk with you about your birth plan and discuss pain control options. Check your blood pressure, breathing, and heartbeat. Assess your baby's heartbeat. Monitor your uterus for contractions. Check whether your bag of water (amniotic sac) has broken (ruptured). Insert an IV into one of your veins. This may be used to give you fluids and medicines. Monitoring Your health care team may assess your contractions (uterine monitoring) and your baby's heart rate (fetal monitoring). You may need to be monitored: Often, but not continuously (intermittently). All the time or for long periods at a time (continuously). Continuous monitoring may be needed if: You are taking certain medicines, such as medicine to relieve pain or make your contractions stronger. You have pregnancy or labor complications. Monitoring may be done by: Placing a special stethoscope or a handheld monitoring device on your abdomen to check your baby's  heartbeat and to check for contractions. Placing monitors on your abdomen (external monitors) to record your baby's heartbeat and the frequency and length of contractions. Placing monitors inside your uterus through your vagina (internal monitors) to record your baby's heartbeat and the frequency, length, and strength of your contractions. Depending on the type of monitor, it may remain in your uterus or on your baby's head until birth. Telemetry. This is a type of continuous monitoring that can be done with external or internal monitors. Instead of having to stay in bed, you are able to move around. Physical exam Your health care team may perform frequent physical exams. This may include: Checking how and where your baby is positioned in your uterus. Checking your cervix to determine: Whether it is thinning out (effacing). Whether it is opening up (dilating). What happens during labor and delivery?  Normal labor and delivery is divided into the following three stages: Stage 1 This is the longest stage of labor. Throughout this stage, you will feel contractions. Contractions generally feel mild, infrequent, and irregular at first. They get stronger, more frequent, and more regular as you move through this stage. You may have contractions about every 2-3 minutes. This stage ends when your cervix is completely dilated to 4 inches (10 cm) and completely effaced. Stage 2 This stage starts once your cervix is completely effaced and dilated and lasts until the delivery of your baby. This is the stage where you will feel an urge to push your baby out of your vagina. You may feel stretching and burning pain, especially when the widest part of your baby's head passes through the vaginal opening (crowning). Once your baby is delivered, the umbilical cord will be   clamped and cut. Timing of cutting the cord will depend on your wishes, your baby's health, and your health care provider's practices. Your baby  will be placed on your bare chest (skin-to-skin contact) in an upright position and covered with a warm blanket. If you are choosing to breastfeed, watch your baby for feeding cues, like rooting or sucking, and help the baby to your breast for his or her first feeding. Stage 3 This stage starts immediately after the birth of your baby and ends after you deliver the placenta. This stage may take anywhere from 5 to 30 minutes. After your baby has been delivered, you will feel contractions as your body expels the placenta. These contractions also help your uterus get smaller and reduce bleeding. What can I expect after labor and delivery? After labor is over, you and your baby will be assessed closely until you are ready to go home. Your health care team will teach you how to care for yourself and your baby. You and your baby may be encouraged to stay in the same room (rooming in) during your hospital stay. This will help promote early bonding and successful breastfeeding. Your uterus will be checked and massaged regularly (fundal massage). You may continue to receive fluids and medicines through an IV. You will have some soreness and pain in your abdomen, vagina, and the area of skin between your vaginal opening and your anus (perineum). If an incision was made near your vagina (episiotomy) or if you had some vaginal tearing during delivery, cold compresses may be placed on your episiotomy or your tear. This helps to reduce pain and swelling. It is normal to have vaginal bleeding after delivery. Wear a sanitary pad for vaginal bleeding and discharge. Summary Vaginal delivery means that you will give birth by pushing your baby out of your birth canal (vagina). Your health care team will monitor you and your baby throughout the stages of labor. After you deliver your baby, your health care team will continue to assess you and your baby to ensure you are both recovering as expected after delivery. This  information is not intended to replace advice given to you by your health care provider. Make sure you discuss any questions you have with your health care provider. Document Revised: 12/06/2019 Document Reviewed: 12/06/2019 Elsevier Patient Education  2023 [employer] Inc[employer]  

## 2022-03-05 ENCOUNTER — Ambulatory Visit (INDEPENDENT_AMBULATORY_CARE_PROVIDER_SITE_OTHER): Payer: Medicaid Other | Admitting: Obstetrics and Gynecology

## 2022-03-05 ENCOUNTER — Encounter: Payer: Self-pay | Admitting: Obstetrics and Gynecology

## 2022-03-05 ENCOUNTER — Other Ambulatory Visit: Payer: Medicaid Other

## 2022-03-05 VITALS — BP 115/77 | HR 80 | Wt 148.0 lb

## 2022-03-05 DIAGNOSIS — O365930 Maternal care for other known or suspected poor fetal growth, third trimester, not applicable or unspecified: Secondary | ICD-10-CM

## 2022-03-05 DIAGNOSIS — O0993 Supervision of high risk pregnancy, unspecified, third trimester: Secondary | ICD-10-CM

## 2022-03-05 DIAGNOSIS — O365990 Maternal care for other known or suspected poor fetal growth, unspecified trimester, not applicable or unspecified: Secondary | ICD-10-CM

## 2022-03-05 DIAGNOSIS — Z3A38 38 weeks gestation of pregnancy: Secondary | ICD-10-CM | POA: Diagnosis not present

## 2022-03-05 NOTE — Progress Notes (Signed)
Subjective:  April [name] is a 42 y.o. [phone] at 70w1dbeing seen today for ongoing prenatal care.  She is currently monitored for the following issues for this high-risk pregnancy and has MDD (major depressive disorder); Cocaine dependence with cocaine-induced mood disorder [place]; Late prenatal care; History of drug use; History of postpartum hypertension; Depression with anxiety; Smoker; Cystic fibrosis carrier; History of prior pregnancy with IUGR newborn; History of precipitous labor and delivery; High-risk pregnancy; Fetal growth restriction antepartum; and Opioid use disorder in remission on their problem list.  Patient reports  general discomforts of pregnancy .  Contractions: Not present.  .  Movement: Present. Denies leaking of fluid.   The following portions of the patient's history were reviewed and updated as appropriate: allergies, current medications, past family history, past medical history, past social history, past surgical history and problem list. Problem list updated.  Objective:   Vitals:   03/05/22 1128  BP: 115/77  Pulse: 80  Weight: 148 lb (67.1 kg)    Fetal Status: Fetal Heart Rate (bpm): NST   Movement: Present     General:  Alert, oriented and cooperative. Patient is in no acute distress.  Skin: Skin is warm and dry. No rash noted.   Cardiovascular: Normal heart rate noted  Respiratory: Normal respiratory effort, no problems with respiration noted  Abdomen: Soft, gravid, appropriate for gestational age. Pain/Pressure: Absent     Pelvic:  Cervical exam performed        Extremities: Normal range of motion.  Edema: Trace  Mental Status: Normal mood and affect. Normal behavior. Normal judgment and thought content.   Urinalysis:      Assessment and Plan:  Pregnancy: [id number] 364w1d1. Fetal growth restriction antepartum 17 % EFW on 1/23  NST 135-140 baseline, + accels, no decles, no ut cts, reactive  2. High-risk pregnancy in third  trimester Stable Labor precautions IOL [place]  Term labor symptoms and general obstetric precautions including but not limited to vaginal bleeding, contractions, leaking of fluid and fetal movement were reviewed in detail with the patient. Please refer to After Visit Summary for other counseling recommendations.  Return in about 1 week (around 03/12/2022) for OB visit, face to face, MD only.   ErChancy MilroyMD

## 2022-03-06 ENCOUNTER — Encounter (HOSPITAL_COMMUNITY): Payer: Self-pay

## 2022-03-06 ENCOUNTER — Other Ambulatory Visit: Payer: Self-pay | Admitting: Advanced Practice Midwife

## 2022-03-06 ENCOUNTER — Telehealth (HOSPITAL_COMMUNITY): Payer: Self-pay | Admitting: *Deleted

## 2022-03-06 NOTE — Telephone Encounter (Signed)
Preadmission screen  

## 2022-03-08 ENCOUNTER — Ambulatory Visit (INDEPENDENT_AMBULATORY_CARE_PROVIDER_SITE_OTHER): Payer: Medicaid Other | Admitting: *Deleted

## 2022-03-08 ENCOUNTER — Other Ambulatory Visit: Payer: Self-pay | Admitting: Obstetrics & Gynecology

## 2022-03-08 ENCOUNTER — Telehealth: Payer: Self-pay | Admitting: Neonatology

## 2022-03-08 VITALS — BP 128/85 | HR 84 | Wt 146.0 lb

## 2022-03-08 DIAGNOSIS — F1190 Opioid use, unspecified, uncomplicated: Secondary | ICD-10-CM

## 2022-03-08 DIAGNOSIS — O0993 Supervision of high risk pregnancy, unspecified, third trimester: Secondary | ICD-10-CM

## 2022-03-08 DIAGNOSIS — O365930 Maternal care for other known or suspected poor fetal growth, third trimester, not applicable or unspecified: Secondary | ICD-10-CM | POA: Diagnosis not present

## 2022-03-08 DIAGNOSIS — O365990 Maternal care for other known or suspected poor fetal growth, unspecified trimester, not applicable or unspecified: Secondary | ICD-10-CM

## 2022-03-08 DIAGNOSIS — Z3A38 38 weeks gestation of pregnancy: Secondary | ICD-10-CM

## 2022-03-08 MED ORDER — BUPRENORPHINE HCL-NALOXONE HCL 4-1 MG SL FILM: ORAL_FILM | SUBLINGUAL | 0 refills | Status: DC

## 2022-03-08 NOTE — Progress Notes (Deleted)
   NURSE VISIT- NST  SUBJECTIVE:  April [name] is a 42 y.o. [phone] female at [redacted]w[redacted]d here for a NST for pregnancy complicated by {NST Pregnancy complicated [id number]  She reports {Fetal Movement:20184} fetal movement, contractions: {obgyn contractions reg/irreg:312982}, vaginal bleeding: {desc; vaginal bleeding:14141}, membranes: {pe membranes intrapartum:314523}.   OBJECTIVE:  BP 128/85   Pulse 84   Wt 146 lb (66.2 kg)   LMP 06/05/2021   BMI 25.06 kg/m   Appears well, no apparent distress  No results found for this or any previous visit (from the past 24 hour(s)).  NST: FHR baseline *** bpm, Variability: {fhr variability:21617::"moderate"}, Accelerations:{DESC; PRESENT/NOT PRESENT:21021351::"present"}, Decelerations:  {fhr decel present:21619::"Absent"}= Cat ***/{fhr accels:25447::"reactive"} Toco: {obgyn contractions reg/irreg:312982}   ASSESSMENT: [id number] 345w4dith {NST Pregnancy complicated byXX123456ST {NST Findings:23188::"reactive"}  PLAN: EFM strip reviewed by {Blank single:19197::"Dr. Eure","Dr. OzSonnie AlamoErvin","Dr. Newton","Kim Booker, CNM, WHNP","Fran Kamani Magnussen-Dishmon, CNM","Kim ShBrigitte PulseCNM"}   Recommendations: {Blank single:19197::"keep next appointment as scheduled","work-in for BPP","[facility] *** notified","[facility] *** notified"}    Costantino Kohlbeck  03/08/2022 11:47 AM

## 2022-03-08 NOTE — Progress Notes (Signed)
Rx for suboxone sent in

## 2022-03-08 NOTE — Progress Notes (Signed)
   NURSE VISIT- NST  SUBJECTIVE:  April [name] is a 42 y.o. [phone] female at [redacted]w[redacted]d here for a NST for pregnancy complicated by AMA and FGR.  She reports active fetal movement, contractions: none, vaginal bleeding: none, membranes: intact.   OBJECTIVE:  BP 128/85   Pulse 84   Wt 146 lb (66.2 kg)   LMP 06/05/2021   BMI 25.06 kg/m   Appears well, no apparent distress  No results found for this or any previous visit (from the past 24 hour(s)).  NST: FHR baseline 125 bpm, Variability: moderate, Accelerations:present, Decelerations:  Absent= Cat 1/reactive Toco: occasional   ASSESSMENT: [id number] 37w4dith AMA and FGR NST reactive  PLAN: EFM strip reviewed by Dr. OzNelda Marseille Recommendations: keep next appointment as scheduled    LaAlice Rieger2/16/2024 11:58 AM

## 2022-03-11 ENCOUNTER — Telehealth (HOSPITAL_COMMUNITY): Payer: Self-pay | Admitting: *Deleted

## 2022-03-11 ENCOUNTER — Encounter (HOSPITAL_COMMUNITY): Payer: Self-pay | Admitting: *Deleted

## 2022-03-11 NOTE — Telephone Encounter (Signed)
Preadmission screen  

## 2022-03-12 ENCOUNTER — Encounter (HOSPITAL_COMMUNITY): Payer: Self-pay | Admitting: Obstetrics and Gynecology

## 2022-03-12 ENCOUNTER — Ambulatory Visit: Payer: Medicaid Other | Admitting: Obstetrics & Gynecology

## 2022-03-12 ENCOUNTER — Other Ambulatory Visit: Payer: Self-pay

## 2022-03-12 ENCOUNTER — Other Ambulatory Visit: Payer: Medicaid Other

## 2022-03-12 ENCOUNTER — Inpatient Hospital Stay (HOSPITAL_COMMUNITY): Payer: Medicaid Other | Admitting: Anesthesiology

## 2022-03-12 ENCOUNTER — Inpatient Hospital Stay (HOSPITAL_COMMUNITY)
Admission: RE | Admit: 2022-03-12 | Discharge: 2022-03-14 | DRG: 797 | Disposition: A | Discharge status: Home / Self Care | Payer: Medicaid Other | Attending: Obstetrics and Gynecology | Admitting: Obstetrics and Gynecology

## 2022-03-12 ENCOUNTER — Inpatient Hospital Stay (HOSPITAL_COMMUNITY): Payer: Medicaid Other

## 2022-03-12 DIAGNOSIS — O0993 Supervision of high risk pregnancy, unspecified, third trimester: Secondary | ICD-10-CM

## 2022-03-12 DIAGNOSIS — Z7982 Long term (current) use of aspirin: Secondary | ICD-10-CM | POA: Diagnosis not present

## 2022-03-12 DIAGNOSIS — F1191 Opioid use, unspecified, in remission: Secondary | ICD-10-CM | POA: Diagnosis present

## 2022-03-12 DIAGNOSIS — O99334 Smoking (tobacco) complicating childbirth: Secondary | ICD-10-CM | POA: Diagnosis not present

## 2022-03-12 DIAGNOSIS — O365990 Maternal care for other known or suspected poor fetal growth, unspecified trimester, not applicable or unspecified: Secondary | ICD-10-CM

## 2022-03-12 DIAGNOSIS — Z302 Encounter for sterilization: Secondary | ICD-10-CM

## 2022-03-12 DIAGNOSIS — Z8679 Personal history of other diseases of the circulatory system: Secondary | ICD-10-CM

## 2022-03-12 DIAGNOSIS — F329 Major depressive disorder, single episode, unspecified: Secondary | ICD-10-CM | POA: Diagnosis present

## 2022-03-12 DIAGNOSIS — O26893 Other specified pregnancy related conditions, third trimester: Secondary | ICD-10-CM | POA: Diagnosis present

## 2022-03-12 DIAGNOSIS — O134 Gestational [pregnancy-induced] hypertension without significant proteinuria, complicating childbirth: Secondary | ICD-10-CM | POA: Diagnosis present

## 2022-03-12 DIAGNOSIS — O99324 Drug use complicating childbirth: Secondary | ICD-10-CM | POA: Diagnosis present

## 2022-03-12 DIAGNOSIS — Z8759 Personal history of other complications of pregnancy, childbirth and the puerperium: Secondary | ICD-10-CM

## 2022-03-12 DIAGNOSIS — O09529 Supervision of elderly multigravida, unspecified trimester: Principal | ICD-10-CM

## 2022-03-12 DIAGNOSIS — Z3A39 39 weeks gestation of pregnancy: Secondary | ICD-10-CM | POA: Diagnosis not present

## 2022-03-12 DIAGNOSIS — O365930 Maternal care for other known or suspected poor fetal growth, third trimester, not applicable or unspecified: Secondary | ICD-10-CM | POA: Diagnosis not present

## 2022-03-12 DIAGNOSIS — F1190 Opioid use, unspecified, uncomplicated: Secondary | ICD-10-CM

## 2022-03-12 DIAGNOSIS — F17200 Nicotine dependence, unspecified, uncomplicated: Secondary | ICD-10-CM | POA: Diagnosis present

## 2022-03-12 DIAGNOSIS — F17210 Nicotine dependence, cigarettes, uncomplicated: Secondary | ICD-10-CM | POA: Diagnosis present

## 2022-03-12 DIAGNOSIS — O135 Gestational [pregnancy-induced] hypertension without significant proteinuria, complicating the puerperium: Secondary | ICD-10-CM | POA: Diagnosis not present

## 2022-03-12 DIAGNOSIS — O0930 Supervision of pregnancy with insufficient antenatal care, unspecified trimester: Secondary | ICD-10-CM

## 2022-03-12 DIAGNOSIS — Z141 Cystic fibrosis carrier: Secondary | ICD-10-CM

## 2022-03-12 DIAGNOSIS — O09523 Supervision of elderly multigravida, third trimester: Secondary | ICD-10-CM | POA: Diagnosis not present

## 2022-03-12 DIAGNOSIS — O0990 Supervision of high risk pregnancy, unspecified, unspecified trimester: Secondary | ICD-10-CM

## 2022-03-12 LAB — COMPREHENSIVE METABOLIC PANEL
ALT: 18 U/L (ref 0–44)
AST: 26 U/L (ref 15–41)
Albumin: 2.4 g/dL — ABNORMAL LOW (ref 3.5–5.0)
Alkaline Phosphatase: 165 U/L — ABNORMAL HIGH (ref 38–126)
Anion gap: 8 (ref 5–15)
BUN: 7 mg/dL (ref 6–20)
CO2: 24 mmol/L (ref 22–32)
Calcium: 8.4 mg/dL — ABNORMAL LOW (ref 8.9–10.3)
Chloride: 103 mmol/L (ref 98–111)
Creatinine, Ser: 0.55 mg/dL (ref 0.44–1.00)
GFR, Estimated: >60 mL/min (ref >60)
Glucose, Bld: 104 mg/dL — ABNORMAL HIGH (ref 70–99)
Potassium: 3.9 mmol/L (ref 3.5–5.1)
Sodium: 135 mmol/L (ref 135–145)
Total Bilirubin: 0.5 mg/dL (ref 0.3–1.2)
Total Protein: 5.9 g/dL — ABNORMAL LOW (ref 6.5–8.1)

## 2022-03-12 LAB — CBC
HCT: 33.6 % — ABNORMAL LOW (ref 36.0–46.0)
Hemoglobin: 10.9 g/dL — ABNORMAL LOW (ref 12.0–15.0)
MCH: 30.4 pg (ref 26.0–34.0)
MCHC: 32.4 g/dL (ref 30.0–36.0)
MCV: 93.9 fL (ref 80.0–100.0)
Platelets: 242 10*3/uL (ref 150–400)
RBC: 3.58 MIL/uL — ABNORMAL LOW (ref 3.87–5.11)
RDW: 13.8 % (ref 11.5–15.5)
WBC: 6.7 10*3/uL (ref 4.0–10.5)
nRBC: 0 % (ref 0.0–0.2)

## 2022-03-12 LAB — TYPE AND SCREEN
ABO/RH(D): O POS
Antibody Screen: NEGATIVE

## 2022-03-12 LAB — OB RESULTS CONSOLE RPR: RPR: NONREACTIVE

## 2022-03-12 LAB — RPR: RPR Ser Ql: NONREACTIVE

## 2022-03-12 MED ORDER — EPHEDRINE 5 MG/ML INJ: 10.0000 mg | INTRAVENOUS | Status: DC | PRN

## 2022-03-12 MED ORDER — DIBUCAINE (PERIANAL) 1 % EX OINT: 1.0000 | TOPICAL_OINTMENT | CUTANEOUS | Status: DC | PRN

## 2022-03-12 MED ORDER — LACTATED RINGERS IV SOLN: 500.0000 mL | INTRAVENOUS | Status: DC | PRN

## 2022-03-12 MED ORDER — TERBUTALINE SULFATE 1 MG/ML IJ SOLN: 0.2500 mg | Freq: Once | INTRAMUSCULAR | Status: DC | PRN

## 2022-03-12 MED ORDER — TETANUS-DIPHTH-ACELL PERTUSSIS 5-2.5-18.5 LF-MCG/0.5 IM SUSY
0.5000 mL | PREFILLED_SYRINGE | Freq: Once | INTRAMUSCULAR | Status: DC
  Filled 2022-03-12 (×2): qty 0.5

## 2022-03-12 MED ORDER — OXYTOCIN-SODIUM CHLORIDE 30-0.9 UT/500ML-% IV SOLN
2.5000 [IU]/h | INTRAVENOUS | Status: DC
  Administered 2022-03-12: 2.5 [IU]/h via INTRAVENOUS

## 2022-03-12 MED ORDER — OXYCODONE-ACETAMINOPHEN 5-325 MG PO TABS: 1.0000 | ORAL_TABLET | ORAL | Status: DC | PRN

## 2022-03-12 MED ORDER — DIPHENHYDRAMINE HCL 50 MG/ML IJ SOLN: 12.5000 mg | INTRAMUSCULAR | Status: DC | PRN

## 2022-03-12 MED ORDER — LACTATED RINGERS IV SOLN: 500.0000 mL | Freq: Once | INTRAVENOUS | Status: DC

## 2022-03-12 MED ORDER — LIDOCAINE HCL (PF) 1 % IJ SOLN
INTRAMUSCULAR | Status: DC | PRN
  Administered 2022-03-12: 11 mL via EPIDURAL

## 2022-03-12 MED ORDER — LACTATED RINGERS AMNIOINFUSION: INTRAVENOUS | Status: DC

## 2022-03-12 MED ORDER — SIMETHICONE 80 MG PO CHEW: 80.0000 mg | CHEWABLE_TABLET | ORAL | Status: DC | PRN

## 2022-03-12 MED ORDER — ACETAMINOPHEN 325 MG PO TABS: 650.0000 mg | ORAL_TABLET | ORAL | Status: DC | PRN

## 2022-03-12 MED ORDER — DIPHENHYDRAMINE HCL 25 MG PO CAPS: 25.0000 mg | ORAL_CAPSULE | Freq: Four times a day (QID) | ORAL | Status: DC | PRN

## 2022-03-12 MED ORDER — PHENYLEPHRINE 80 MCG/ML (10ML) SYRINGE FOR IV PUSH (FOR BLOOD PRESSURE SUPPORT): 80.0000 ug | PREFILLED_SYRINGE | INTRAVENOUS | Status: DC | PRN

## 2022-03-12 MED ORDER — ONDANSETRON HCL 4 MG PO TABS: 4.0000 mg | ORAL_TABLET | ORAL | Status: DC | PRN

## 2022-03-12 MED ORDER — FENTANYL-BUPIVACAINE-NACL 0.5-0.125-0.9 MG/250ML-% EP SOLN
EPIDURAL | Status: AC
  Filled 2022-03-12: qty 250

## 2022-03-12 MED ORDER — SOD CITRATE-CITRIC ACID 500-334 MG/5ML PO SOLN: 30.0000 mL | ORAL | Status: DC | PRN

## 2022-03-12 MED ORDER — ONDANSETRON HCL 4 MG/2ML IJ SOLN: 4.0000 mg | INTRAMUSCULAR | Status: DC | PRN

## 2022-03-12 MED ORDER — OXYTOCIN-SODIUM CHLORIDE 30-0.9 UT/500ML-% IV SOLN
1.0000 m[IU]/min | INTRAVENOUS | Status: DC
  Administered 2022-03-12: 2 m[IU]/min via INTRAVENOUS
  Filled 2022-03-12: qty 500

## 2022-03-12 MED ORDER — FENTANYL CITRATE (PF) 100 MCG/2ML IJ SOLN
50.0000 ug | INTRAMUSCULAR | Status: DC | PRN
  Administered 2022-03-12 (×2): 100 ug via INTRAVENOUS
  Filled 2022-03-12 (×2): qty 2

## 2022-03-12 MED ORDER — OXYCODONE-ACETAMINOPHEN 5-325 MG PO TABS: 2.0000 | ORAL_TABLET | ORAL | Status: DC | PRN

## 2022-03-12 MED ORDER — BUPRENORPHINE HCL-NALOXONE HCL 2-0.5 MG SL SUBL
2.0000 | SUBLINGUAL_TABLET | Freq: Two times a day (BID) | SUBLINGUAL | Status: DC
  Administered 2022-03-12 – 2022-03-14 (×4): 2 via SUBLINGUAL
  Filled 2022-03-12 (×4): qty 2

## 2022-03-12 MED ORDER — FENTANYL-BUPIVACAINE-NACL 0.5-0.125-0.9 MG/250ML-% EP SOLN
12.0000 mL/h | EPIDURAL | Status: DC | PRN
  Administered 2022-03-12: 12 mL/h via EPIDURAL

## 2022-03-12 MED ORDER — SENNOSIDES-DOCUSATE SODIUM 8.6-50 MG PO TABS
2.0000 | ORAL_TABLET | ORAL | Status: DC
  Administered 2022-03-13 – 2022-03-14 (×2): 2 via ORAL
  Filled 2022-03-12 (×2): qty 2

## 2022-03-12 MED ORDER — IBUPROFEN 600 MG PO TABS
600.0000 mg | ORAL_TABLET | Freq: Four times a day (QID) | ORAL | Status: DC
  Administered 2022-03-13 – 2022-03-14 (×6): 600 mg via ORAL
  Filled 2022-03-12 (×8): qty 1

## 2022-03-12 MED ORDER — ONDANSETRON HCL 4 MG/2ML IJ SOLN: 4.0000 mg | Freq: Four times a day (QID) | INTRAMUSCULAR | Status: DC | PRN

## 2022-03-12 MED ORDER — PRENATAL MULTIVITAMIN CH
1.0000 | ORAL_TABLET | Freq: Every day | ORAL | Status: DC
  Administered 2022-03-13: 1 via ORAL
  Filled 2022-03-12 (×3): qty 1

## 2022-03-12 MED ORDER — OXYTOCIN BOLUS FROM INFUSION
333.0000 mL | Freq: Once | INTRAVENOUS | Status: AC
  Administered 2022-03-12: 333 mL via INTRAVENOUS

## 2022-03-12 MED ORDER — BENZOCAINE-MENTHOL 20-0.5 % EX AERO: 1.0000 | INHALATION_SPRAY | CUTANEOUS | Status: DC | PRN

## 2022-03-12 MED ORDER — COCONUT OIL OIL
1.0000 | TOPICAL_OIL | Status: DC | PRN
  Administered 2022-03-13: 1 via TOPICAL

## 2022-03-12 MED ORDER — ZOLPIDEM TARTRATE 5 MG PO TABS: 5.0000 mg | ORAL_TABLET | Freq: Every evening | ORAL | Status: DC | PRN

## 2022-03-12 MED ORDER — WITCH HAZEL-GLYCERIN EX PADS: 1.0000 | MEDICATED_PAD | CUTANEOUS | Status: DC | PRN

## 2022-03-12 MED ORDER — LIDOCAINE HCL (PF) 1 % IJ SOLN: 30.0000 mL | INTRAMUSCULAR | Status: DC | PRN

## 2022-03-12 MED ORDER — LACTATED RINGERS IV SOLN: INTRAVENOUS | Status: DC

## 2022-03-12 NOTE — Discharge Summary (Shared)
Postpartum Discharge Summary  Date of Service updated***     Patient Name: April [name] DOB: 01-25-1980 MRN: [id number]  Date of admission: 03/12/2022 Delivery date:03/12/2022  Delivering provider: Concepcion Living  Date of discharge: 03/12/2022  Admitting diagnosis: AMA (advanced maternal age) multigravida 47+ [O09.529] Intrauterine pregnancy: [redacted]w[redacted]d    Secondary diagnosis:  Principal Problem:   AMA (advanced maternal age) multigravida 35+ Active Problems:   MDD (major depressive disorder)   Late prenatal care   History of postpartum hypertension   Smoker   Cystic fibrosis carrier   History of prior pregnancy with IUGR newborn   History of precipitous labor and delivery   High-risk pregnancy   Fetal growth restriction antepartum   Opioid use disorder in remission   Vaginal delivery  Additional problems: ***    Discharge diagnosis: Term Pregnancy Delivered                                              Post partum procedures:{Postpartum procedures:23558} Augmentation: AROM and Pitocin Complications: None  Hospital course: Induction of Labor With Vaginal Delivery   42y.o. yo [id number] 333w1das admitted to the hospital 03/12/2022 for induction of labor.  Indication for induction: AMA and abnormal UA dopplers .  Patient had an uncomplicated labor course. Membrane Rupture Time/Date: 1:37 PM ,03/12/2022   Delivery Method:Vaginal, Spontaneous  Episiotomy: None  Lacerations:  None  Details of delivery can be found in separate delivery note.  Patient had a postpartum course complicated by***. Patient is discharged home 03/12/22.  Newborn Data: Birth date:03/12/2022  Birth time:6:06 PM  Gender:Female  Living status:Living  Apgars: ,  Weight:   Magnesium Sulfate received: No BMZ received: No Rhophylac:N/A MMR:N/A T-DaP:Given prenatallyGiven prenatally Flu: No Transfusion:{Transfusion received:30440034}  Physical exam  Vitals:   03/12/22 1702 03/12/22 1752  03/12/22 1756 03/12/22 1801  BP: (!) 144/82 (!) 148/65 (!) 136/121 (!) 150/95  Pulse: 72 81 87 94  Resp:      Temp:      TempSrc:      SpO2:  96% 98%   Weight:      Height:       General: {Exam; general:21111117} Lochia: {Desc; appropriate/inappropriate:30686::"appropriate"} Uterine Fundus: {Desc; firm/soft:30687} Incision: {Exam; incision:21111123} DVT Evaluation: {Exam; dvt:2111122} Labs: Lab Results  Component Value Date   WBC 6.7 03/12/2022   HGB 10.9 (L) 03/12/2022   HCT 33.6 (L) 03/12/2022   MCV 93.9 03/12/2022   PLT 242 03/12/2022      Latest Ref Rng & Units 03/12/2022    8:34 AM  CMP  Glucose 70 - 99 mg/dL 104   BUN 6 - 20 mg/dL 7   Creatinine 0.44 - 1.00 mg/dL 0.55   Sodium 135 - 145 mmol/L 135   Potassium 3.5 - 5.1 mmol/L 3.9   Chloride 98 - 111 mmol/L 103   CO2 22 - 32 mmol/L 24   Calcium 8.9 - 10.3 mg/dL 8.4   Total Protein 6.5 - 8.1 g/dL 5.9   Total Bilirubin 0.3 - 1.2 mg/dL 0.5   Alkaline Phos 38 - 126 U/L 165   AST 15 - 41 U/L 26   ALT 0 - 44 U/L 18    Edinburgh Score:    07/10/2020    2:29 PM  Edinburgh Postnatal Depression Scale Screening Tool  I have been able to laugh  and see the funny side of things. 0  I have looked forward with enjoyment to things. 0  I have blamed myself unnecessarily when things went wrong. 0  I have been anxious or worried for no good reason. 1  I have felt scared or panicky for no good reason. 0  Things have been getting on top of me. 0  I have been so unhappy that I have had difficulty sleeping. 0  I have felt sad or miserable. 0  I have been so unhappy that I have been crying. 0  The thought of harming myself has occurred to me. 0  Edinburgh Postnatal Depression Scale Total 1     After visit meds:  Allergies as of 03/12/2022       Reactions   Other Hives   Cashews     Med Rec must be completed prior to using this [facility]***        Discharge home in stable condition Infant Feeding: {Baby  feeding:23562} Infant Disposition:{CHL IP OB HOME WITH DX:[id number] Discharge instruction: per After Visit Summary and Postpartum booklet. Activity: Advance as tolerated. Pelvic rest for 6 weeks.  Diet: {OB BY:630183 Future Appointments:No future appointments. Follow up Visit: Message sent   Please schedule this patient for a In person postpartum visit in 4 weeks with the following provider: Any provider. Additional Postpartum F/U:Postpartum Depression checkup  High risk pregnancy complicated by:  AMA , resolved IUGR with abnormal umbilical artery dopplers  Delivery mode:  Vaginal, Spontaneous  Anticipated Birth Control:  BTL done PP   03/12/2022 Concepcion Living, MD

## 2022-03-12 NOTE — Progress Notes (Signed)
LABOR PROGRESS NOTE  April [name] is a 42 y.o. [phone] at [redacted]w[redacted]d admitted for IOL  Subjective: Comfortable. Feeling more contractions   Objective: BP 136/88   Pulse 80   Temp 97.6 F (36.4 C) (Oral)   Resp 17   Ht 5' 4"$  (1.626 m)   Wt 66.7 kg   LMP 06/05/2021   BMI 25.23 kg/m  or  Vitals:   03/12/22 1441 03/12/22 1501 03/12/22 1531 03/12/22 1601  BP: 131/86 136/69 133/62 136/88  Pulse: 85 77 85 80  Resp:      Temp:      TempSrc:      Weight:      Height:         Dilation: 4 Effacement (%): 80 Station: -1 Presentation: Vertex Exam by:: Dr CCaron PresumeFHT: baseline rate 140, moderate varibility, + acel, occasional variable decel Toco: difficutly to trace   Labs: Lab Results  Component Value Date   WBC 6.7 03/12/2022   HGB 10.9 (L) 03/12/2022   HCT 33.6 (L) 03/12/2022   MCV 93.9 03/12/2022   PLT 242 03/12/2022    Patient Active Problem List   Diagnosis Date Noted   AMA (advanced maternal age) multigravida 35+ 03/12/2022   Opioid use disorder in remission 01/01/2022   High-risk pregnancy 12/20/2021   Fetal growth restriction antepartum 12/20/2021   History of prior pregnancy with IUGR newborn 07/10/2020   History of precipitous labor and delivery 07/10/2020   Cystic fibrosis carrier 05/16/2020   Late prenatal care 05/02/2020   History of drug use 05/02/2020   History of postpartum hypertension 05/02/2020   Depression with anxiety 05/02/2020   Smoker 05/02/2020   Cocaine dependence with cocaine-induced mood disorder ([facility]    MDD (major depressive disorder) 09/02/2018    Assessment / Plan: 42y.o. [id number] 327w1dere for IOL for AMA and elevated UC dopplers   Labor: Continuing to progress, continue to titrate pitocin  Fetal Wellbeing:  Cat 2, occasional variables but otherwise reassuring. If become more frequent will place IUPC for amnioinfusion  Pain Control:  Per patient Anticipated MOD:  Vaginal   ViGifford ShaveMD  OB Fellow  03/12/2022,  4:19 PM

## 2022-03-12 NOTE — Anesthesia Procedure Notes (Signed)
Epidural Patient location during procedure: OB Start time: 03/12/2022 5:40 PM End time: 03/12/2022 5:53 PM  Staffing Anesthesiologist: Lynda Rainwater, MD Performed: anesthesiologist   Preanesthetic Checklist Completed: patient identified, IV checked, site marked, risks and benefits discussed, surgical consent, monitors and equipment checked, pre-op evaluation and timeout performed  Epidural Patient position: sitting Prep: ChloraPrep Patient monitoring: heart rate, cardiac monitor, continuous pulse ox and blood pressure Approach: midline Location: L2-L3 Injection technique: LOR saline  Needle:  Needle type: Tuohy  Needle gauge: 17 G Needle length: 9 cm Needle insertion depth: 4 cm Catheter type: closed end flexible Catheter size: 20 Guage Catheter at skin depth: 8 cm Test dose: negative  Assessment Events: blood not aspirated, injection not painful, no injection resistance, no paresthesia and negative IV test  Additional Notes Reason for block:procedure for pain

## 2022-03-12 NOTE — Lactation Note (Addendum)
This note was copied from a baby's chart. Lactation Consultation Note  Patient Name: April [name] [id number] Date: 03/12/2022 Reason for consult: Initial assessment;Difficult latch;Mother's request;Term;Infant < 6lbs Age:41 hours  Birth Parent complain of sore nipples, LC observed Birth Parent has abrasions with cuts above both nipples. LC observed infant's mouth notice tight upper labial frenulum tight from lip to upper gum line and lower lingual frenulum has short band that teeters  from tongue tip to the floor of infant's mouth. Birth Parent attempted to latch infant on her right breast using the cradle hold position, infant briefly latch 2 minutes then fell asleep.   Birth Parent will follow LPTI/ LBW infant feeding policy and understands to supplement infant with EBM and formula each feeding. Birth Parent will feed infant 8 times within 24 hours, and limit total feedings chest and bottle to 30 minutes or less. Birth Parent will attempt to latch infant but after few minutes if infant doesn't latch to stop and supplement infant with any EBM/ formula base on infant;s birth weight on day 1 Birth Parent will offer infant 10-12 mls per feeding.  LC used purple and clear nipple to supplement infant with 22 kcal formula, infant appears to have  immature suck, that's not coordinated and infant tongue thrusts nipple out of her mouth. LC slowly spoon fed infant 12 mls of formula, pace feeding and burped infant. LC informed RN infant needs SLP consult and LC gave [place] nipple to Birth Parents to try and feed infant at the next feeding and that this nipple is good for 24 hours, LC informed RN.   P[place] set Birth Parent up with DEBP, Birth Parent was pumping while LC was in the room and expressed 2 mls of colostrum that she will offer to infant at the next feeding. Birth Parent understands that EBM is safe at room temperature for 4 hours whereas formula must be used within 1 hour.  Birth Parent  understands that infant's feeding plan may change and that [facility] services will follow up with her tomorrow and the feeding plan may change.  Current Feeding Plan: 1- Follow LPTI/ LBW feeding plan. 2- Ask for further latch assistance if needed and limit total feedings chest and bottle to 30 minutes or less and every 3 hours. 3- Continue to use DEBP every 3 hours and give infant any EBM first before offering formula.  Maternal Data Has patient been taught Hand Expression?: Yes Does the patient have breastfeeding experience prior to this delivery?: Yes How long did the patient breastfeed?: Per Birth Parent, she BF 1st child for 2 weeks, her 2nd child only 5 days had latch difficulties.  Feeding Mother's Current Feeding Choice: Breast Milk and Formula Nipple Type: Slow - flow  LATCH Score Latch: Too sleepy or reluctant, no latch achieved, no sucking elicited.  Audible Swallowing: None  Type of Nipple: Everted at rest and after stimulation  Comfort (Breast/Nipple): Filling, red/small blisters or bruises, mild/mod discomfort  Hold (Positioning): Assistance needed to correctly position infant at breast and maintain latch.  LATCH Score: 4   Lactation Tools Discussed/Used Tools: Pump Breast pump type: Double-Electric Breast Pump Pump Education: Setup, frequency, and cleaning;Milk Storage Reason for Pumping: Infant is poor feeder, LBW infant, not latching well at the breast Pumping frequency: Birth Parent will continue to use DEBP every 3 hours for 15 minutes. Pumped volume: 2 mL  Interventions Interventions: Breast feeding basics reviewed;Adjust position;Assisted with latch;Support pillows;Position options;Skin to skin;Education;LPT handout/interventions;LC Services brochure;Hand express;Pace feeding  Discharge Pump: DEBP  Consult Status Consult Status: Follow-up Date: 03/13/22 Follow-up type: In-patient    Eulis Canner 03/12/2022, 9:44 PM

## 2022-03-12 NOTE — Progress Notes (Signed)
LABOR PROGRESS NOTE  April [name] is a 42 y.o. [phone] at [redacted]w[redacted]d admitted for IOL  Subjective: Doing well occasionally feeling contractions  Objective: BP (!) 146/68   Pulse 75   Temp 97.6 F (36.4 C) (Oral)   Resp 17   Ht 5' 4"$  (1.626 m)   Wt 66.7 kg   LMP 06/05/2021   BMI 25.23 kg/m  or  Vitals:   03/12/22 0854 03/12/22 1053 03/12/22 1130 03/12/22 1344  BP: (!) 153/82 136/82 136/79 (!) 146/68  Pulse: 91 84 85 75  Resp:      Temp:      TempSrc:      Weight:      Height:         Dilation: 3.5 Effacement (%): 80 Station: -1 Presentation: Vertex Exam by:: Dr cCaron PresumeFHT: baseline rate 140, moderate varibility, + acel, no decel Toco: difficutly to trace   Labs: Lab Results  Component Value Date   WBC 6.7 03/12/2022   HGB 10.9 (L) 03/12/2022   HCT 33.6 (L) 03/12/2022   MCV 93.9 03/12/2022   PLT 242 03/12/2022    Patient Active Problem List   Diagnosis Date Noted   AMA (advanced maternal age) multigravida 35+ 03/12/2022   Opioid use disorder in remission 01/01/2022   High-risk pregnancy 12/20/2021   Fetal growth restriction antepartum 12/20/2021   History of prior pregnancy with IUGR newborn 07/10/2020   History of precipitous labor and delivery 07/10/2020   Cystic fibrosis carrier 05/16/2020   Late prenatal care 05/02/2020   History of drug use 05/02/2020   History of postpartum hypertension 05/02/2020   Depression with anxiety 05/02/2020   Smoker 05/02/2020   Cocaine dependence with cocaine-induced mood disorder ([facility]    MDD (major depressive disorder) 09/02/2018    Assessment / Plan: 42y.o. [id number] 334w1dere for IOL for AMA and elevated UC dopplers   Labor: Progressiong well, AROM with clear fluid   Fetal Wellbeing:  Cat 1 Pain Control:  Per patient Anticipated MOD:  Vaginal   ViGifford ShaveMD  OB Fellow  03/12/2022, 1:45 PM

## 2022-03-12 NOTE — H&P (Signed)
OBSTETRIC ADMISSION HISTORY AND PHYSICAL  April [name] is a 42 y.o. female [phone] with IUP at 41w1dby LMP presenting for IOL. She reports +FMs, No LOF, no VB, no blurry vision, headaches or peripheral edema, and RUQ pain.  She plans on breast and formula feeding. She request tubal ligation for birth control. [facility]  Dating: By LMP --->  Estimated Date of Delivery: 03/18/22  Sono:    '@[redacted]w[redacted]d'$ , CWD, normal anatomy, frank breach presentation, posterior placenta, 2297g, 17% EFW   Prenatal History/Complications: AMA, FGR, smoker, history of drug use, LPNC  Past Medical History: Past Medical History:  Diagnosis Date   Anxiety    Bronchitis    Depression    Miscarriage     Past Surgical History: Past Surgical History:  Procedure Laterality Date   ANKLE SURGERY     left    Obstetrical History: OB History     Gravida  4   Para  2   Term  2   Preterm      AB  1   Living  2      SAB  1   IAB      Ectopic      Multiple  0   Live Births  2           Social History Social History   Socioeconomic History   Marital status: Significant Other    Spouse name: Freddy   Number of children: 1   Years of education: Not on file   Highest education level: Not on file  Occupational History   Not on file  Tobacco Use   Smoking status: Every Day    Packs/day: 0.25    Years: 12.00    Total pack years: 3.00    Types: Cigarettes   Smokeless tobacco: Never  Vaping Use   Vaping Use: Never used  Substance and Sexual Activity   Alcohol use: Not Currently    Alcohol/week: 11.0 standard drinks of alcohol    Types: 11 Cans of beer per week   Drug use: Not Currently    Types: Cocaine, Marijuana    Comment: last use 12/21/18   Sexual activity: Yes    Birth control/protection: None  Other Topics Concern   Not on file  Social History Narrative   Not on file   Social Determinants of Health   Financial Resource Strain:  Low Risk  (12/20/2021)   Overall Financial Resource Strain (CARDIA)    Difficulty of Paying Living Expenses: Not hard at all  Food Insecurity: No Food Insecurity (03/12/2022)   Hunger Vital Sign    Worried About Running Out of Food in the Last Year: Never true    Ran Out of Food in the Last Year: Never true  Transportation Needs: No Transportation Needs (03/12/2022)   PRAPARE - THydrologist(Medical): No    Lack of Transportation (Non-Medical): No  Physical Activity: Insufficiently Active (12/20/2021)   Exercise Vital Sign    Days of Exercise per Week: 1 day    Minutes of Exercise per Session: 10 min  Stress: No Stress Concern Present (12/20/2021)   [place]   Feeling of Stress : Only a little  Social Connections: Moderately Isolated (12/20/2021)   Social Connection and Isolation Panel [NHANES]    Frequency of Communication with Friends and Family: More than three times a week  Frequency of Social Gatherings with Friends and Family: Three times a week    Attends Religious Services: Never    Active Member of Clubs or Organizations: No    Attends Music therapist: Never    Marital Status: Living with partner    Family History: Family History  Problem Relation Age of Onset   Hypertension Mother    Gout Mother    Drug abuse Mother    Alcoholism Father    Cancer Maternal Aunt    Hypertension Maternal Grandmother    Cancer Maternal Grandmother        ovarian, pancreatic   Cancer Maternal Grandfather        lung   Mental retardation Cousin    Diabetes Other    Cancer Other    Hypertension Other     Allergies: Allergies  Allergen Reactions   Other Hives    Cashews    Medications Prior to Admission  Medication Sig Dispense Refill Last Dose   acetaminophen (TYLENOL) 500 MG tablet Take 500 mg by mouth every 6 (six) hours as needed.      aspirin EC 81 MG tablet  Take 2 tablets (162 mg total) by mouth daily. 60 tablet 6    Buprenorphine HCl-Naloxone HCl (SUBOXONE) 4-1 MG FILM Place 1 Film under the tongue 2 (two) times daily for 14 days 28 each 0    naloxone (NARCAN) nasal spray 4 mg/0.1 mL Use as needed to reverse opioid overdose (Patient not taking: Reported on 01/28/2022) 2 each 1    Prenatal Vit-Fe Fumarate-FA (PRENATAL MULTIVITAMIN) TABS tablet Take 1 tablet by mouth daily at 12 noon.        Review of Systems   All systems reviewed and negative except as stated in HPI  Blood pressure (!) 153/82, pulse 91, temperature 97.6 F (36.4 C), temperature source Oral, resp. rate 17, height '5\' 4"'$  (1.626 m), weight 66.7 kg, last menstrual period 06/05/2021, unknown if currently breastfeeding. General appearance: alert, cooperative, and no distress Lungs: clear to auscultation bilaterally Heart: regular rate and rhythm Abdomen: soft, non-tender; bowel sounds normal Extremities: Homans sign is negative, no sign of DVT Presentation: cephalic Fetal monitoringBaseline: 125 bpm, Variability: Good {> 6 bpm), Accelerations: Reactive, and Decelerations: Absent Uterine activityNone     Prenatal labs: ABO, Rh: --/--/O POS (02/20 VC:[id number]) Antibody: NEG (02/20 VC:[id number]) Rubella: 2.96 (12/04 0829) RPR: Non Reactive (12/04 0829)  HBsAg: Negative (12/04 0829)  HIV: Non Reactive (12/04 0829)  GBS: Negative/-- (01/30 1330)  1 hr Glucola 147 Genetic screening  low risk Anatomy US female  Prenatal Transfer Tool  Maternal Diabetes: No Genetic Screening: Normal Maternal Ultrasounds/Referrals: Normal Fetal Ultrasounds or other Referrals:  None Maternal Substance Abuse:  Yes:  Type: Smoker, Cocaine Significant Maternal Medications:  Meds include: Other: Buprenorphine Significant Maternal Lab Results:  Group B Strep negative Number of Prenatal Visits:greater than 3 verified prenatal visits Other Comments:  None  Results for orders placed or performed during the  hospital encounter of 03/12/22 (from the past 24 hour(s))  Type and screen   Collection Time: 03/12/22  8:32 AM  Result Value Ref Range   ABO/RH(D) O POS    Antibody Screen NEG    Sample Expiration      03/15/2022,2359 Performed at [facility] [address] [place], [place] [address]   CBC   Collection Time: 03/12/22  8:34 AM  Result Value Ref Range   WBC 6.7 4.0 - 10.5 K/uL   RBC  3.58 (L) 3.87 - 5.11 MIL/uL   Hemoglobin 10.9 (L) 12.0 - 15.0 g/dL   HCT 33.6 (L) 36.0 - 46.0 %   MCV 93.9 80.0 - 100.0 fL   MCH 30.4 26.0 - 34.0 pg   MCHC 32.4 30.0 - 36.0 g/dL   RDW 13.8 11.5 - 15.5 %   Platelets 242 150 - 400 K/uL   nRBC 0.0 0.0 - 0.2 %  Comprehensive metabolic panel   Collection Time: 03/12/22  8:34 AM  Result Value Ref Range   Sodium 135 135 - 145 mmol/L   Potassium 3.9 3.5 - 5.1 mmol/L   Chloride 103 98 - 111 mmol/L   CO2 24 22 - 32 mmol/L   Glucose, Bld 104 (H) 70 - 99 mg/dL   BUN 7 6 - 20 mg/dL   Creatinine, Ser 0.55 0.44 - 1.00 mg/dL   Calcium 8.4 (L) 8.9 - 10.3 mg/dL   Total Protein 5.9 (L) 6.5 - 8.1 g/dL   Albumin 2.4 (L) 3.5 - 5.0 g/dL   AST 26 15 - 41 U/L   ALT 18 0 - 44 U/L   Alkaline Phosphatase 165 (H) 38 - 126 U/L   Total Bilirubin 0.5 0.3 - 1.2 mg/dL   GFR, Estimated >60 >60 mL/min   Anion gap 8 5 - 15    Patient Active Problem List   Diagnosis Date Noted   AMA (advanced maternal age) multigravida 35+ 03/12/2022   Opioid use disorder in remission 01/01/2022   High-risk pregnancy 12/20/2021   Fetal growth restriction antepartum 12/20/2021   History of prior pregnancy with IUGR newborn 07/10/2020   History of precipitous labor and delivery 07/10/2020   Cystic fibrosis carrier 05/16/2020   Late prenatal care 05/02/2020   History of drug use 05/02/2020   History of postpartum hypertension 05/02/2020   Depression with anxiety 05/02/2020   Smoker 05/02/2020   Cocaine dependence with cocaine-induced mood disorder (H[facility])    MDD (major depressive  disorder) 09/02/2018    Assessment/Plan:  ESI ROSENBAUM is a 42 y.o. [id number] at 90w1dhere for IOL  #Labor: Induction started with Pitocin.. AROM at next check #Pain: per patient request  #FWB: Cat 1 #ID:  GBS neg #MOF: Both #MOC:BTL OUD on suboxone: home meds taken this morning with order nighttime dose.   CAugustin Coupe Student-PA  03/12/2022, 10:22 AM  Fellow Attestation  I saw and evaluated the patient, performing the key elements of the service.I  personally performed or re-performed the history, physical exam, and medical decision making activities of this service and have verified that the service and findings are accurately documented in the student's note. I developed the management plan that is described in the medical student's note, and I agree with the content, with my edits above.    VGifford Shave MD

## 2022-03-13 ENCOUNTER — Other Ambulatory Visit: Payer: Self-pay

## 2022-03-13 ENCOUNTER — Encounter (HOSPITAL_COMMUNITY)
Admission: RE | Disposition: A | Discharge status: Home / Self Care | Payer: Self-pay | Source: Home / Self Care | Attending: Obstetrics and Gynecology

## 2022-03-13 ENCOUNTER — Inpatient Hospital Stay (HOSPITAL_COMMUNITY): Payer: Medicaid Other | Admitting: Anesthesiology

## 2022-03-13 DIAGNOSIS — Z302 Encounter for sterilization: Secondary | ICD-10-CM

## 2022-03-13 HISTORY — PX: TUBAL LIGATION: SHX77

## 2022-03-13 SURGERY — LIGATION, FALLOPIAN TUBE, POSTPARTUM
Anesthesia: Spinal

## 2022-03-13 MED ORDER — DEXAMETHASONE SODIUM PHOSPHATE 4 MG/ML IJ SOLN
INTRAMUSCULAR | Status: AC
  Filled 2022-03-13: qty 2

## 2022-03-13 MED ORDER — IBUPROFEN 600 MG PO TABS: 600.0000 mg | ORAL_TABLET | Freq: Four times a day (QID) | ORAL | Status: DC

## 2022-03-13 MED ORDER — ACETAMINOPHEN 325 MG PO TABS: 650.0000 mg | ORAL_TABLET | ORAL | Status: DC | PRN

## 2022-03-13 MED ORDER — DEXAMETHASONE SODIUM PHOSPHATE 4 MG/ML IJ SOLN
INTRAMUSCULAR | Status: DC | PRN
  Administered 2022-03-13: 8 mg via INTRAVENOUS

## 2022-03-13 MED ORDER — BUPIVACAINE HCL (PF) 0.25 % IJ SOLN
INTRAMUSCULAR | Status: AC
  Filled 2022-03-13: qty 30

## 2022-03-13 MED ORDER — MIDAZOLAM HCL 2 MG/2ML IJ SOLN
INTRAMUSCULAR | Status: AC
  Filled 2022-03-13: qty 2

## 2022-03-13 MED ORDER — COCONUT OIL OIL: 1.0000 | TOPICAL_OIL | Status: DC | PRN

## 2022-03-13 MED ORDER — MIDAZOLAM HCL 5 MG/5ML IJ SOLN
INTRAMUSCULAR | Status: DC | PRN
  Administered 2022-03-13 (×2): 1 mg via INTRAVENOUS

## 2022-03-13 MED ORDER — FENTANYL CITRATE (PF) 100 MCG/2ML IJ SOLN
INTRAMUSCULAR | Status: DC | PRN
  Administered 2022-03-13: 15 ug via INTRATHECAL

## 2022-03-13 MED ORDER — STERILE WATER FOR IRRIGATION IR SOLN
Status: DC | PRN
  Administered 2022-03-13: 1

## 2022-03-13 MED ORDER — FUROSEMIDE 20 MG PO TABS
20.0000 mg | ORAL_TABLET | Freq: Every day | ORAL | Status: DC
  Administered 2022-03-13: 20 mg via ORAL
  Filled 2022-03-13: qty 1

## 2022-03-13 MED ORDER — FENTANYL CITRATE (PF) 100 MCG/2ML IJ SOLN
INTRAMUSCULAR | Status: AC
  Filled 2022-03-13: qty 2

## 2022-03-13 MED ORDER — WITCH HAZEL-GLYCERIN EX PADS: 1.0000 | MEDICATED_PAD | CUTANEOUS | Status: DC | PRN

## 2022-03-13 MED ORDER — ONDANSETRON HCL 4 MG/2ML IJ SOLN: 4.0000 mg | INTRAMUSCULAR | Status: DC | PRN

## 2022-03-13 MED ORDER — BUPIVACAINE IN DEXTROSE 0.75-8.25 % IT SOLN
INTRATHECAL | Status: DC | PRN
  Administered 2022-03-13: 1.4 mL via INTRATHECAL

## 2022-03-13 MED ORDER — ACETAMINOPHEN 10 MG/ML IV SOLN
INTRAVENOUS | Status: DC | PRN
  Administered 2022-03-13: 1000 mg via INTRAVENOUS

## 2022-03-13 MED ORDER — PRENATAL MULTIVITAMIN CH: 1.0000 | ORAL_TABLET | Freq: Every day | ORAL | Status: DC

## 2022-03-13 MED ORDER — LACTATED RINGERS IV SOLN: INTRAVENOUS | Status: DC

## 2022-03-13 MED ORDER — DIBUCAINE (PERIANAL) 1 % EX OINT: 1.0000 | TOPICAL_OINTMENT | CUTANEOUS | Status: DC | PRN

## 2022-03-13 MED ORDER — OXYCODONE HCL 5 MG PO TABS: 5.0000 mg | ORAL_TABLET | ORAL | Status: DC | PRN

## 2022-03-13 MED ORDER — OXYTOCIN-SODIUM CHLORIDE 30-0.9 UT/500ML-% IV SOLN: 2.5000 [IU]/h | INTRAVENOUS | Status: DC | PRN

## 2022-03-13 MED ORDER — MEASLES, MUMPS & RUBELLA VAC IJ SOLR: 0.5000 mL | Freq: Once | INTRAMUSCULAR | Status: DC

## 2022-03-13 MED ORDER — BENZOCAINE-MENTHOL 20-0.5 % EX AERO: 1.0000 | INHALATION_SPRAY | CUTANEOUS | Status: DC | PRN

## 2022-03-13 MED ORDER — ONDANSETRON HCL 4 MG PO TABS: 4.0000 mg | ORAL_TABLET | ORAL | Status: DC | PRN

## 2022-03-13 MED ORDER — FENTANYL CITRATE (PF) 100 MCG/2ML IJ SOLN: 25.0000 ug | INTRAMUSCULAR | Status: DC | PRN

## 2022-03-13 MED ORDER — SODIUM CHLORIDE 0.9 % IR SOLN
Status: DC | PRN
  Administered 2022-03-13: 1

## 2022-03-13 MED ORDER — LACTATED RINGERS IV SOLN: INTRAVENOUS | Status: DC | PRN

## 2022-03-13 MED ORDER — SENNOSIDES-DOCUSATE SODIUM 8.6-50 MG PO TABS: 2.0000 | ORAL_TABLET | ORAL | Status: DC

## 2022-03-13 MED ORDER — SIMETHICONE 80 MG PO CHEW: 80.0000 mg | CHEWABLE_TABLET | ORAL | Status: DC | PRN

## 2022-03-13 MED ORDER — ZOLPIDEM TARTRATE 5 MG PO TABS: 5.0000 mg | ORAL_TABLET | Freq: Every evening | ORAL | Status: DC | PRN

## 2022-03-13 MED ORDER — ACETAMINOPHEN 10 MG/ML IV SOLN
INTRAVENOUS | Status: AC
  Filled 2022-03-13: qty 100

## 2022-03-13 MED ORDER — ONDANSETRON HCL 4 MG/2ML IJ SOLN
INTRAMUSCULAR | Status: DC | PRN
  Administered 2022-03-13: 4 mg via INTRAVENOUS

## 2022-03-13 MED ORDER — PROMETHAZINE HCL 25 MG/ML IJ SOLN: 6.2500 mg | INTRAMUSCULAR | Status: DC | PRN

## 2022-03-13 MED ORDER — POVIDONE-IODINE 10 % EX SWAB: 2.0000 | Freq: Once | CUTANEOUS | Status: DC

## 2022-03-13 MED ORDER — FAMOTIDINE 20 MG PO TABS
40.0000 mg | ORAL_TABLET | Freq: Once | ORAL | Status: AC
  Administered 2022-03-13: 40 mg via ORAL
  Filled 2022-03-13: qty 2

## 2022-03-13 MED ORDER — DEXAMETHASONE SODIUM PHOSPHATE 10 MG/ML IJ SOLN
INTRAMUSCULAR | Status: AC
  Filled 2022-03-13: qty 1

## 2022-03-13 MED ORDER — DIPHENHYDRAMINE HCL 25 MG PO CAPS: 25.0000 mg | ORAL_CAPSULE | Freq: Four times a day (QID) | ORAL | Status: DC | PRN

## 2022-03-13 MED ORDER — METOCLOPRAMIDE HCL 10 MG PO TABS
10.0000 mg | ORAL_TABLET | Freq: Once | ORAL | Status: AC
  Administered 2022-03-13: 10 mg via ORAL
  Filled 2022-03-13: qty 1

## 2022-03-13 MED ORDER — BUPIVACAINE HCL (PF) 0.25 % IJ SOLN
INTRAMUSCULAR | Status: DC | PRN
  Administered 2022-03-13: 30 mL

## 2022-03-13 SURGICAL SUPPLY — 21 items
APL PRP STRL LF DISP 70% ISPRP (MISCELLANEOUS) ×2
BLADE SURG 11 STRL SS (BLADE) ×1 IMPLANT
CHLORAPREP W/TINT 26 (MISCELLANEOUS) ×2 IMPLANT
DRSG OPSITE POSTOP 3X4 (GAUZE/BANDAGES/DRESSINGS) ×1 IMPLANT
DURAPREP 26ML APPLICATOR (WOUND CARE) IMPLANT
GLOVE BIOGEL PI IND STRL 7.0 (GLOVE) ×1 IMPLANT
GLOVE BIOGEL PI IND STRL 7.5 (GLOVE) ×2 IMPLANT
GLOVE ECLIPSE 7.5 STRL STRAW (GLOVE) ×1 IMPLANT
GOWN STRL REUS W/TWL LRG LVL3 (GOWN DISPOSABLE) ×2 IMPLANT
NEEDLE HYPO 22GX1.5 SAFETY (NEEDLE) ×1 IMPLANT
NS IRRIG 1000ML POUR BTL (IV SOLUTION) ×1 IMPLANT
PACK ABDOMINAL MINOR (CUSTOM PROCEDURE TRAY) ×1 IMPLANT
PROTECTOR NERVE ULNAR (MISCELLANEOUS) ×1 IMPLANT
SPONGE LAP 18X18 RF (DISPOSABLE) ×1 IMPLANT
SPONGE LAP 4X18 RFD (DISPOSABLE) IMPLANT
SUT PLAIN 0 NONE (SUTURE) IMPLANT
SUT VICRYL 0 UR6 27IN ABS (SUTURE) ×1 IMPLANT
SUT VICRYL 4-0 PS2 18IN ABS (SUTURE) ×1 IMPLANT
SYR CONTROL 10ML LL (SYRINGE) ×1 IMPLANT
TOWEL OR 17X24 6PK STRL BLUE (TOWEL DISPOSABLE) ×2 IMPLANT
TRAY FOLEY W/BAG SLVR 14FR (SET/KITS/TRAYS/PACK) ×1 IMPLANT

## 2022-03-13 NOTE — Anesthesia Preprocedure Evaluation (Addendum)
Anesthesia Evaluation  Patient identified by MRN, date of birth, ID band Patient awake    Reviewed: Allergy & Precautions, NPO status , Patient's Chart, lab work & pertinent test results  Airway Mallampati: II  TM Distance: >3 FB Neck ROM: Full    Dental  (+) Dental Advisory Given, Poor Dentition, Chipped   Pulmonary Current Smoker and Patient abstained from smoking.   Pulmonary exam normal breath sounds clear to auscultation       Cardiovascular hypertension, Normal cardiovascular exam Rhythm:Regular Rate:Normal     Neuro/Psych  PSYCHIATRIC DISORDERS Anxiety Depression    negative neurological ROS     GI/Hepatic negative GI ROS, Neg liver ROS,,,Suboxone   Endo/Other  negative endocrine ROS    Renal/GU negative Renal ROS     Musculoskeletal negative musculoskeletal ROS (+)    Abdominal   Peds  Hematology  (+) Blood dyscrasia, anemia Plt 242k   Anesthesia Other Findings   Reproductive/Obstetrics S/p SVD 03/12/22 at 1833                             Anesthesia Physical Anesthesia Plan  ASA: 2  Anesthesia Plan: Spinal   Post-op Pain Management: Tylenol PO (pre-op)*   Induction: Intravenous  PONV Risk Score and Plan: 1 and Midazolam, Dexamethasone, Ondansetron and Scopolamine patch - Pre-op  Airway Management Planned: Natural Airway  Additional Equipment:   Intra-op Plan:   Post-operative Plan:   Informed Consent: I have reviewed the patients History and Physical, chart, labs and discussed the procedure including the risks, benefits and alternatives for the proposed anesthesia with the patient or authorized representative who has indicated his/her understanding and acceptance.     Dental advisory given  Plan Discussed with: CRNA  Anesthesia Plan Comments:         Anesthesia Quick Evaluation

## 2022-03-13 NOTE — Anesthesia Procedure Notes (Signed)
Spinal  Patient location during procedure: OR Start time: 03/13/2022 2:23 PM End time: 03/13/2022 2:26 PM Reason for block: surgical anesthesia Staffing Performed: anesthesiologist  Anesthesiologist: Santa Lighter, MD Performed by: Santa Lighter, MD Authorized by: Santa Lighter, MD   Preanesthetic Checklist Completed: patient identified, IV checked, risks and benefits discussed, surgical consent, monitors and equipment checked, pre-op evaluation and timeout performed Spinal Block Patient position: sitting Prep: DuraPrep and site prepped and draped Patient monitoring: continuous pulse ox and blood pressure Approach: midline Location: L3-4 Injection technique: single-shot Needle Needle type: Pencan  Needle gauge: 24 G Assessment Sensory level: T6 Events: CSF return Additional Notes Functioning IV was confirmed and monitors were applied. Sterile prep and drape, including hand hygiene, mask and sterile gloves were used. The patient was positioned and the spine was prepped. The skin was anesthetized with lidocaine.  Free flow of clear CSF was obtained prior to injecting local anesthetic into the CSF.  The spinal needle aspirated freely following injection.  The needle was carefully withdrawn.  The patient tolerated the procedure well. Consent was obtained prior to procedure with all questions answered and concerns addressed. Risks including but not limited to bleeding, infection, nerve damage, paralysis, failed block, inadequate analgesia, allergic reaction, high spinal, itching and headache were discussed and the patient wished to proceed.   Hoy Morn, MD

## 2022-03-13 NOTE — Progress Notes (Addendum)
Patient desires permanent sterilization.  Other reversible forms of contraception were discussed with patient; she declines all other modalities. Risks of procedure discussed with patient including but not limited to: risk of regret, permanence of method, bleeding, infection, injury to surrounding organs and need for additional procedures.  Failure risk of 1-2 % with increased risk of ectopic gestation if pregnancy occurs was also discussed with patient.  Patient verbalized understanding of these risks and wants to proceed with sterilization. We discussed ligation vs salpingectomy and patient elects the latter (if safe to do so) to decrease cancer risk, aware it is irreversible. Written informed consent obtained.  To OR when ready.

## 2022-03-13 NOTE — Anesthesia Postprocedure Evaluation (Signed)
Anesthesia Post Note  Patient: April [name]  Procedure(s) Performed: POST PARTUM TUBAL LIGATION     Patient location during evaluation: Mother Baby Anesthesia Type: Epidural Level of consciousness: awake, oriented and awake and alert Pain management: pain level controlled Vital Signs Assessment: post-procedure vital signs reviewed and stable Respiratory status: spontaneous breathing, respiratory function stable and nonlabored ventilation Cardiovascular status: stable Postop Assessment: no headache, adequate PO intake, able to ambulate, patient able to bend at knees and no apparent nausea or vomiting Anesthetic complications: no   No notable events documented.  Last Vitals:  Vitals:   03/13/22 0131 03/13/22 0531  BP: 125/84 121/70  Pulse: 84 72  Resp: 18 18  Temp: 36.7 C 36.7 C  SpO2: 100% 100%    Last Pain:  Vitals:   03/13/22 0735  TempSrc:   PainSc: 0-No pain   Pain Goal:                   Maeghan Canny

## 2022-03-13 NOTE — Progress Notes (Signed)
Post Partum Day 1 Subjective: Voiding, ambulating well. Late ate [employer] sandwich at 0130, other than that NPO except small sip of water w/ ibuprofen @ 0530. +flatus.  Lochia and pain wnl.  Denies dizziness, lightheadedness, or sob. No complaints.   Objective: Blood pressure 121/70, pulse 72, temperature 98 F (36.7 C), temperature source Oral, resp. rate 18, height 5' 4"$  (1.626 m), weight 66.7 kg, last menstrual period 06/05/2021, SpO2 100 %, unknown if currently breastfeeding.  Physical Exam:  General: alert, cooperative, and no distress Lochia: appropriate Uterine Fundus: firm Incision: n/a DVT Evaluation: No evidence of DVT seen on physical exam. Negative Homan's sign. No cords or calf tenderness. No significant calf/ankle edema.  Recent Labs    03/12/22 0834  HGB 10.9*  HCT 33.6*    Assessment/Plan: BTL at 1400 today, to remain NPO Breastfeeding Intrapartum GHTN, bp's stable now, will add lasix, monitor for need for bp meds. Note routed to FT to schedule 1wk bp check.    LOS: 1 day   Roma Schanz, CNM 03/13/2022, 6:41 AM

## 2022-03-13 NOTE — Op Note (Signed)
April [name] 03/12/2022 - 03/13/2022  PREOPERATIVE DIAGNOSES: Undesired fertility, bilateral salpingectomy  POSTOPERATIVE DIAGNOSES: same  PROCEDURE:  Postpartum Bilateral Salpingectomy  SURGEON: Dr.  Laurey Arrow, MD  ASSISTANT:  Dr. Darcey Nora.  An experienced assistant was required given the standard of surgical care given the complexity of the case.  This assistant was needed for exposure, dissection, suctioning, retraction, instrument exchange, and for overall help during the procedure.  ANESTHESIA:  Epidural and local analgesia using 10 ml of [id number] Marcaine  COMPLICATIONS:  None immediate.  ESTIMATED BLOOD LOSS: <10 ml.  INDICATIONS:  42 y.o. [name] with undesired fertility, status post vaginal delivery, desires permanent sterilization.  Other reversible forms of contraception were discussed with patient; she declines all other modalities. Risks of procedure discussed with patient including but not limited to: risk of regret, permanence of method, bleeding, infection, injury to surrounding organs and need for additional procedures.  Discussed failure risk of 1% with increased risk of ectopic gestation if pregnancy occurs.  Also discussed possibility of post-tubal syndrome with increased pelvic pain or menstrual irregularities.  Patient verbalized understanding of these risks and wants to proceed with sterilization.  Written informed consent obtained.     FINDINGS:  Normal uterus, tubes, and ovaries. Excised fallopian tubes were sent to pathology.   PROCEDURE DETAILS: The patient was taken to the operating room where her epidural anesthesia was dosed up to surgical level and found to be adequate.  She was then placed in the dorsal supine position and prepped and draped in sterile fashion.   After an adequate timeout was performed, attention was turned to the patient's abdomen   A small transverse skin incision was made under the umbilical fold after local analgesia was administered.. The  incision was taken down to the layer of fascia using the scalpel, and fascia was incised, and extended bilaterally using Mayo scissors. The peritoneum was entered in a blunt fashion.    Attention was then turned to the left fallopian tube. Two pairs of Kelly forceps were placed on the mesosalpinx underneath most of the tube.  This pedicle was double suture ligated with 2-0 plain gut, and the tube including the fimbriated end was excised.  The right fallopian tube was then identified, doubly ligated, and excised in a similar fashion allowing for bilateral tubal sterilization via bilateral salpingectomy.  Good hemostasis was noted overall.  Good hemostasis was noted overall.  The instruments were then removed from the patient's abdomen and the fascial incision was repaired with 0 Vicryl, and the skin was closed with a 4-0 Vicryl subcuticular stitch. 30 ml of 0.25% marcaine infused subcutaneously. The patient tolerated the procedure well.  Instrument, sponge, and needle counts were correct times three.  The patient was then taken to the recovery room awake and in stable condition.   Laurey Arrow, [place] for [employer], [place]

## 2022-03-14 ENCOUNTER — Other Ambulatory Visit (HOSPITAL_COMMUNITY): Payer: Self-pay

## 2022-03-14 ENCOUNTER — Encounter (HOSPITAL_COMMUNITY): Payer: Self-pay | Admitting: Anesthesiology

## 2022-03-14 ENCOUNTER — Other Ambulatory Visit: Payer: Self-pay | Admitting: Family Medicine

## 2022-03-14 ENCOUNTER — Other Ambulatory Visit: Payer: Self-pay | Admitting: Obstetrics and Gynecology

## 2022-03-14 DIAGNOSIS — F1190 Opioid use, unspecified, uncomplicated: Secondary | ICD-10-CM

## 2022-03-14 LAB — CBC
HCT: 29.8 % — ABNORMAL LOW (ref 36.0–46.0)
Hemoglobin: 9.9 g/dL — ABNORMAL LOW (ref 12.0–15.0)
MCH: 31.4 pg (ref 26.0–34.0)
MCHC: 33.2 g/dL (ref 30.0–36.0)
MCV: 94.6 fL (ref 80.0–100.0)
Platelets: 221 10*3/uL (ref 150–400)
RBC: 3.15 MIL/uL — ABNORMAL LOW (ref 3.87–5.11)
RDW: 13.9 % (ref 11.5–15.5)
WBC: 8.8 10*3/uL (ref 4.0–10.5)
nRBC: 0 % (ref 0.0–0.2)

## 2022-03-14 MED ORDER — FUROSEMIDE 20 MG PO TABS
20.0000 mg | ORAL_TABLET | Freq: Every day | ORAL | Status: DC
  Administered 2022-03-14: 20 mg via ORAL
  Filled 2022-03-14: qty 1

## 2022-03-14 MED ORDER — BUPRENORPHINE HCL-NALOXONE HCL 4-1 MG SL FILM: ORAL_FILM | SUBLINGUAL | 0 refills | Status: AC

## 2022-03-14 MED ORDER — FUROSEMIDE 20 MG PO TABS
20.0000 mg | ORAL_TABLET | Freq: Every day | ORAL | 0 refills | Status: DC
  Filled 2022-03-14: qty 4, 4d supply, fill #0

## 2022-03-14 MED ORDER — ACETAMINOPHEN 325 MG PO TABS
650.0000 mg | ORAL_TABLET | ORAL | 0 refills | Status: AC | PRN
  Filled 2022-03-14: qty 30, 3d supply, fill #0

## 2022-03-14 MED ORDER — IBUPROFEN 600 MG PO TABS
600.0000 mg | ORAL_TABLET | Freq: Four times a day (QID) | ORAL | 0 refills | Status: AC
  Filled 2022-03-14: qty 30, 8d supply, fill #0

## 2022-03-14 NOTE — Clinical Social Work Maternal (Signed)
CLINICAL SOCIAL WORK MATERNAL/CHILD NOTE  Patient Details  Name: April [name] MRN: [id number] Date of Birth: 1980-07-26  Date:  03/14/2022  Clinical Social Worker Initiating Note:  Bernadene Bell Tajia Szeliga Date/Time: Initiated:  03/14/22/1506     Child's Name:  April [name]   Biological Parents:  Mother, Father Noemie Stuffle 1981[birth year]25, Levan Hurst 08/18/1978)   Need for Interpreter:  None   Reason for Referral:  Current Substance Use/Substance Use During Pregnancy  , Behavioral Health Concerns   Address:  [place] [address]    Phone number:  [phone] (home)     Additional phone number: [phone]  Household Members/Support Persons (HM/SP):   Household Member/Support Person 1, Household Member/Support Person 2   HM/SP Name Relationship DOB or Age  HM/SP -[place] FOB 08/18/1978  HM/SP -[place] daughter 05/31/2020  HM/SP -3        HM/SP -4        HM/SP -5        HM/SP -6        HM/SP -7        HM/SP -8          Natural Supports (not living in the home):  Parent, Friends   Professional Supports:     Employment: Animator   Type of Work: [employer]   Education:  [place] arranged:    Museum/gallery curator Resources:  [employer]   Other Resources:  Physicist, medical  , [place] Considerations Which May Impact Care:    Strengths:  Engineer, materials, Home prepared for child  , Ability to meet basic needs     Psychotropic Medications:         Pediatrician:       Pediatrician List:   [place]      Pediatrician Fax Number:    Risk Factors/Current Problems:  Substance Use     Cognitive State:  Able to Concentrate  , Alert     Mood/Affect:  Calm  , Comfortable     CSW Assessment: CSW received consult for substance use during pregnancy anxiety and depression. CSW met with MOB  to complete assessment and offer support. CSW entered the room observed MOB up in the room FOB sitting at bedside and the infant lying comfortably in the bassinet.  CSW introduced self, CSW role and reason for visit, MOB was agreeable to visit and allowed FOB to remain in the room. MOB was engaged in throughout the assessment. CSW inquired about how MOB was feeling, MOB reported good. CSW inquired about MOB MH hx, MOB reported no MH concerns in the recent years. CSW assessed for safety, MOB denied any SI or HI. CSW provided education regarding the baby blues period vs. perinatal mood disorders, discussed treatment and gave resources for mental health follow up if concerns arise.  CSW recommends self-evaluation during the postpartum time period using the New Mom Checklist from Postpartum Progress and encouraged MOB to contact a medical professional if symptoms are noted at any time.  MOB reported having good support from her mom, FOB, and her best friend.  CSW inquired about MOB cocaine use during pregnancy, MOB reported her last use was in December. MOB reported she has recently started Suboxone. CSW informed MOB of the hospital drug screen policy, MOB voiced understanding. CSW notified MOB infants  UDS was negative and the CDS was pending. CSW informed MOB if infants CDS was positive for any substances a CPS report would be made. MOB voiced understanding.  CSW provided review of Sudden Infant Death Syndrome (SIDS) precautions.  Mob reported they have all necessary items for the infant including a bassinet and crib as well as a car seat. Mob identified [facility] for infants follow up care. CSW identifies no further need for intervention and no barriers to discharge at this time.  CSW Plan/Description:  No Further Intervention Required/No Barriers to Discharge, Sudden Infant Death Syndrome (SIDS) Education, Perinatal Mood and Anxiety Disorder (PMADs) Education, [place], CSW  Will Continue to Monitor Umbilical Cord Tissue Drug Screen Results and Make Report if Renata Caprice, LCSW 03/14/2022, 3:16 PM

## 2022-03-14 NOTE — Lactation Note (Signed)
This note was copied from a baby's chart. Lactation Consultation Note  Patient Name: April [name] [id number] Date: 03/14/2022 Age : 41 hours old - [place] it on the D/C list  Reason for consult: Follow-up assessment;Term;Infant < 6lbs;Infant weight loss;Breastfeeding assistance As LC entered the room, mom just waking up and baby wide awake rooting and fussy. LC offered to check and change the wet diaper.  LC reviewed the cross cradle position to obtain the depth at the breast. Baby fed 14 mins with multiple swallows.  Mom supplementing after the feeding.  LC reviewed the doc flow sheets and noted consistent feedings , breast and supplementing up to 15 ml. Plenty of wets , and only one stool since birth . Mom brought it to the [facility] attention and confirmed.  Per mom has pumped x 6 and only drops. LC recommended increasing the supplement volume to 20 -25 ml to enhance stooling.  LC recommended since baby is less than 6 pounds and 6 % weight loss to feed at the breast 1st 15 -20 mins ( 30 mins max ) and then supplement increasing volume up to 30 ml by 48 hours.  Post pump both breast for 15 mins and save the milk for the next feeding. Next feeding switch to the other breast.  LC recommended following this plan until baby is back to birth weight gaining steadily.   Maternal Data    Feeding Mother's Current Feeding Choice: Breast Milk and Formula  LATCH Score Latch: Grasps breast easily, tongue down, lips flanged, rhythmical sucking.  Audible Swallowing: Spontaneous and intermittent  Type of Nipple: Everted at rest and after stimulation  Comfort (Breast/Nipple): Soft / non-tender  Hold (Positioning): Assistance needed to correctly position infant at breast and maintain latch.  LATCH Score: 9   Lactation Tools Discussed/Used Tools: Pump Flange Size: 21 Breast pump type: Double-Electric Breast Pump;Manual Pump Education: Milk Storage;Setup, frequency, and  cleaning  Interventions Interventions: Breast feeding basics reviewed;Assisted with latch;Skin to skin;Breast massage;Hand express;Breast compression;Adjust position;Support pillows;Position options;Expressed milk;Hand pump;DEBP;Education;Pace feeding;LC Services brochure  Discharge Discharge Education: Engorgement and breast care;Warning signs for feeding baby;Outpatient recommendation;Outpatient Epic message sent Pump: DEBP;Personal WIC Program: Yes  Consult Status Consult Status: Complete Date: 03/14/22    Myer Haff 03/14/2022, 8:45 AM

## 2022-03-14 NOTE — Anesthesia Postprocedure Evaluation (Signed)
Anesthesia Post Note  Patient: April [name]  Procedure(s) Performed: POST PARTUM TUBAL LIGATION     Patient location during evaluation: PACU Anesthesia Type: Spinal Level of consciousness: awake, awake and alert and oriented Pain management: pain level controlled Vital Signs Assessment: post-procedure vital signs reviewed and stable Respiratory status: spontaneous breathing, nonlabored ventilation and respiratory function stable Cardiovascular status: blood pressure returned to baseline and stable Postop Assessment: no headache, no backache, spinal receding and no apparent nausea or vomiting Anesthetic complications: no   No notable events documented.  Last Vitals:  Vitals:   03/14/22 0250 03/14/22 0516  BP: 125/85 123/82  Pulse: 78 76  Resp: 16 18  Temp: 36.5 C 36.4 C  SpO2:      Last Pain:  Vitals:   03/14/22 0516  TempSrc: Oral  PainSc:                  Santa Lighter

## 2022-03-14 NOTE — Anesthesia Preprocedure Evaluation (Addendum)
Anesthesia Evaluation  Patient identified by MRN, date of birth, ID band Patient awake    Reviewed: Allergy & Precautions, H&P , NPO status , Patient's Chart, lab work & pertinent test results  Airway Mallampati: II  TM Distance: >3 FB Neck ROM: Full    Dental no notable dental hx.    Pulmonary Current Smoker   Pulmonary exam normal breath sounds clear to auscultation       Cardiovascular negative cardio ROS Normal cardiovascular exam Rhythm:Regular Rate:Normal     Neuro/Psych negative neurological ROS  negative psych ROS   GI/Hepatic negative GI ROS, Neg liver ROS,,,  Endo/Other  negative endocrine ROS    Renal/GU negative Renal ROS  negative genitourinary   Musculoskeletal negative musculoskeletal ROS (+)    Abdominal   Peds negative pediatric ROS (+)  Hematology negative hematology ROS (+)   Anesthesia Other Findings   Reproductive/Obstetrics (+) Pregnancy                             Anesthesia Physical Anesthesia Plan  ASA: 2  Anesthesia Plan: Epidural   Post-op Pain Management:    Induction:   PONV Risk Score and Plan:   Airway Management Planned:   Additional Equipment:   Intra-op Plan:   Post-operative Plan:   Informed Consent:   Plan Discussed with:   Anesthesia Plan Comments:        Anesthesia Quick Evaluation

## 2022-03-14 NOTE — Anesthesia Preprocedure Evaluation (Deleted)
Anesthesia Evaluation  Patient identified by MRN, date of birth, ID band Patient awake    Reviewed: Allergy & Precautions, H&P , NPO status , Patient's Chart, lab work & pertinent test results  Airway Mallampati: II  TM Distance: >3 FB Neck ROM: Full    Dental no notable dental hx.    Pulmonary neg pulmonary ROS, Current Smoker   Pulmonary exam normal breath sounds clear to auscultation       Cardiovascular negative cardio ROS Normal cardiovascular exam Rhythm:Regular Rate:Normal     Neuro/Psych negative neurological ROS  negative psych ROS   GI/Hepatic negative GI ROS, Neg liver ROS,,,  Endo/Other  negative endocrine ROS    Renal/GU negative Renal ROS  negative genitourinary   Musculoskeletal negative musculoskeletal ROS (+)    Abdominal   Peds negative pediatric ROS (+)  Hematology negative hematology ROS (+)   Anesthesia Other Findings   Reproductive/Obstetrics negative OB ROS                              Anesthesia Physical Anesthesia Plan  ASA: 2  Anesthesia Plan: Epidural   Post-op Pain Management:    Induction:   PONV Risk Score and Plan:   Airway Management Planned:   Additional Equipment:   Intra-op Plan:   Post-operative Plan:   Informed Consent:   Plan Discussed with:   Anesthesia Plan Comments:         Anesthesia Quick Evaluation

## 2022-03-14 NOTE — Transfer of Care (Signed)
Immediate Anesthesia Transfer of Care Note  Patient: April [name]  Procedure(s) Performed: POST PARTUM TUBAL LIGATION  Patient Location: PACU  Anesthesia Type:Spinal  Level of Consciousness: awake, alert , and oriented  Airway & Oxygen Therapy: Patient Spontanous Breathing and Patient connected to nasal cannula oxygen  Post-op Assessment: Report given to RN and Post -op Vital signs reviewed and stable  Post vital signs: Reviewed and stable  Last Vitals:  Vitals Value Taken Time  BP 123/82 03/14/22 0516  Temp 36.4 C 03/14/22 0516  Pulse 76 03/14/22 0516  Resp 18 03/14/22 0516  SpO2 100 % 03/13/22 1726    Last Pain:  Vitals:   03/14/22 0516  TempSrc: Oral  PainSc:          Complications: No notable events documented.

## 2022-03-14 NOTE — Anesthesia Postprocedure Evaluation (Signed)
Anesthesia Post Note  Patient: April [name]  Procedure(s) Performed: AN AD HOC LABOR EPIDURAL     Patient location during evaluation: PACU Anesthesia Type: General Level of consciousness: awake and alert Pain management: pain level controlled Vital Signs Assessment: post-procedure vital signs reviewed and stable Respiratory status: spontaneous breathing, nonlabored ventilation and respiratory function stable Cardiovascular status: blood pressure returned to baseline and stable Postop Assessment: no apparent nausea or vomiting Anesthetic complications: no   No notable events documented.  Last Vitals:  Vitals:   03/14/22 0250 03/14/22 0516  BP: 125/85 123/82  Pulse: 78 76  Resp: 16 18  Temp: 36.5 C 36.4 C  SpO2:      Last Pain:  Vitals:   03/14/22 1005  TempSrc:   PainSc: 0-No pain   Pain Goal:                   Lynda Rainwater

## 2022-03-15 ENCOUNTER — Encounter (HOSPITAL_COMMUNITY): Payer: Self-pay | Admitting: Obstetrics and Gynecology

## 2022-03-15 LAB — SURGICAL PATHOLOGY

## 2022-03-18 NOTE — Addendum Note (Signed)
Addendum  created 03/18/22 0837 by Lynda Rainwater, MD   Clinical Note Signed

## 2022-03-20 ENCOUNTER — Ambulatory Visit (INDEPENDENT_AMBULATORY_CARE_PROVIDER_SITE_OTHER): Payer: Medicaid Other | Admitting: *Deleted

## 2022-03-20 ENCOUNTER — Other Ambulatory Visit: Payer: Self-pay | Admitting: Advanced Practice Midwife

## 2022-03-20 ENCOUNTER — Encounter: Payer: Self-pay | Admitting: *Deleted

## 2022-03-20 VITALS — BP 133/95 | HR 86 | Ht 64.0 in | Wt 131.5 lb

## 2022-03-20 DIAGNOSIS — Z0130 Encounter for examination of blood pressure without abnormal findings: Secondary | ICD-10-CM

## 2022-03-20 DIAGNOSIS — O165 Unspecified maternal hypertension, complicating the puerperium: Secondary | ICD-10-CM

## 2022-03-20 MED ORDER — NIFEDIPINE ER OSMOTIC RELEASE 30 MG PO TB24: 30.0000 mg | ORAL_TABLET | Freq: Every day | ORAL | 2 refills | Status: AC

## 2022-03-20 NOTE — Progress Notes (Signed)
   NURSE VISIT- BLOOD PRESSURE CHECK  SUBJECTIVE:  April [name] is a 42 y.o. [phone] female here for BP check. She is postpartum, delivery date 03/12/22     HYPERTENSION ROS:  Pregnant/postpartum:  Severe headaches that don't go away with tylenol/other medicines: No  Visual changes (seeing spots/double/blurred vision) No  Severe pain under right breast breast or in center of upper chest No  Severe nausea/vomiting No  Taking medicines as instructed not applicable    OBJECTIVE:  BP (!) 133/95 (BP Location: Right Arm, Patient Position: Sitting, Cuff Size: Normal)   Pulse 86   Ht 5' 4"$  (1.626 m)   Wt 131 lb 8 oz (59.6 kg)   LMP 06/05/2021   Breastfeeding Yes   BMI 22.57 kg/m   Appearance alert, well appearing, and in no distress. 1st BP reading was 146/82 pulse 92.   ASSESSMENT: Postpartum  blood pressure check  PLAN: Discussed with Derrill Memo, CNM   Recommendations: new prescription will be sent   Follow-up: as scheduled   Levy Pupa  03/20/2022 9:41 AM

## 2022-03-27 ENCOUNTER — Other Ambulatory Visit: Payer: Self-pay | Admitting: Obstetrics & Gynecology

## 2022-03-27 ENCOUNTER — Encounter: Payer: Self-pay | Admitting: Obstetrics & Gynecology

## 2022-03-27 DIAGNOSIS — F1191 Opioid use, unspecified, in remission: Secondary | ICD-10-CM

## 2022-03-27 MED ORDER — BUPRENORPHINE HCL-NALOXONE HCL 4-1 MG SL FILM: 1.0000 | ORAL_FILM | Freq: Two times a day (BID) | SUBLINGUAL | 0 refills | Status: AC

## 2022-03-27 NOTE — Progress Notes (Signed)
Suboxone x 30 days for postpartum period.  Janyth Pupa, DO Attending [place], [facility] for [employer], [place]

## 2022-04-16 ENCOUNTER — Ambulatory Visit: Payer: Medicaid Other | Admitting: Women's Health

## 2022-04-19 ENCOUNTER — Telehealth (HOSPITAL_COMMUNITY): Payer: Self-pay

## 2022-04-19 NOTE — Telephone Encounter (Signed)
Preadmission testing 

## 2022-04-21 ENCOUNTER — Telehealth: Payer: Self-pay | Admitting: Neonatology

## 2022-05-19 ENCOUNTER — Telehealth: Payer: Self-pay | Admitting: Pediatrics

## 2022-05-19 NOTE — Telephone Encounter (Signed)
NAS postpartum telephone consult attempted without answer.  HIPAA compliant message left requesting a return call or text.  Will follow again on 5/17.
